# Patient Record
Sex: Male | Born: 1995 | Race: Black or African American | Hispanic: No | Marital: Single | Smoking: Former smoker
Health system: Southern US, Community
[De-identification: ages and names within clinical notes are randomized; demographics above are authoritative.]

## PROBLEM LIST (undated history)

## (undated) DIAGNOSIS — R51 Headache: Secondary | ICD-10-CM

## (undated) DIAGNOSIS — R519 Headache, unspecified: Secondary | ICD-10-CM

## (undated) DIAGNOSIS — R4589 Other symptoms and signs involving emotional state: Secondary | ICD-10-CM

## (undated) HISTORY — PX: NO PAST SURGERIES: SHX2092

---

## 2017-05-14 ENCOUNTER — Other Ambulatory Visit: Payer: Self-pay

## 2017-05-14 ENCOUNTER — Emergency Department (HOSPITAL_COMMUNITY)
Admission: EM | Admit: 2017-05-14 | Discharge: 2017-05-15 | Disposition: A | Discharge status: Home / Self Care | Payer: Medicaid Other | Attending: Emergency Medicine | Admitting: Emergency Medicine

## 2017-05-14 ENCOUNTER — Encounter (HOSPITAL_COMMUNITY): Payer: Self-pay | Admitting: *Deleted

## 2017-05-14 DIAGNOSIS — Z5321 Procedure and treatment not carried out due to patient leaving prior to being seen by health care provider: Secondary | ICD-10-CM | POA: Insufficient documentation

## 2017-05-14 DIAGNOSIS — R51 Headache: Secondary | ICD-10-CM | POA: Insufficient documentation

## 2017-05-14 NOTE — ED Triage Notes (Signed)
The pt is c/o a headache for 4 years  No nausea vomiting he has not taken anything for his headache

## 2017-05-15 NOTE — ED Notes (Signed)
Pt came to desk asking about the wait time. Pt informed that his name was placed in a hall bed and that staff should be out to get him any minute. Pt's name just called for the bed no answer

## 2017-05-17 ENCOUNTER — Encounter (HOSPITAL_COMMUNITY): Payer: Self-pay

## 2017-05-17 ENCOUNTER — Emergency Department (HOSPITAL_COMMUNITY)
Admission: EM | Admit: 2017-05-17 | Discharge: 2017-05-17 | Disposition: A | Discharge status: Home / Self Care | Payer: Medicaid Other | Attending: Emergency Medicine | Admitting: Emergency Medicine

## 2017-05-17 ENCOUNTER — Other Ambulatory Visit: Payer: Self-pay

## 2017-05-17 ENCOUNTER — Emergency Department (HOSPITAL_COMMUNITY): Payer: Medicaid Other

## 2017-05-17 DIAGNOSIS — F17200 Nicotine dependence, unspecified, uncomplicated: Secondary | ICD-10-CM | POA: Diagnosis not present

## 2017-05-17 DIAGNOSIS — R51 Headache: Secondary | ICD-10-CM | POA: Insufficient documentation

## 2017-05-17 DIAGNOSIS — R519 Headache, unspecified: Secondary | ICD-10-CM

## 2017-05-17 HISTORY — DX: Headache, unspecified: R51.9

## 2017-05-17 HISTORY — DX: Headache: R51

## 2017-05-17 IMAGING — CT CT HEAD W/O CM
3 series · 16 of 47 positions shown, 19 images · non-contrast
Comparison: None.

CLINICAL DATA: Pain behind the eyes for 4 years. Getting worse.
Headache.

EXAM:
CT HEAD WITHOUT CONTRAST
TECHNIQUE: Contiguous axial images were obtained from the base of the skull
through the vertex without intravenous contrast.

[Series 2: head wo · axial · 0.47mm/px · z∈[-86,+40]mm · 10 of 30 slices shown, 13 images]
[im 3/30  brain]
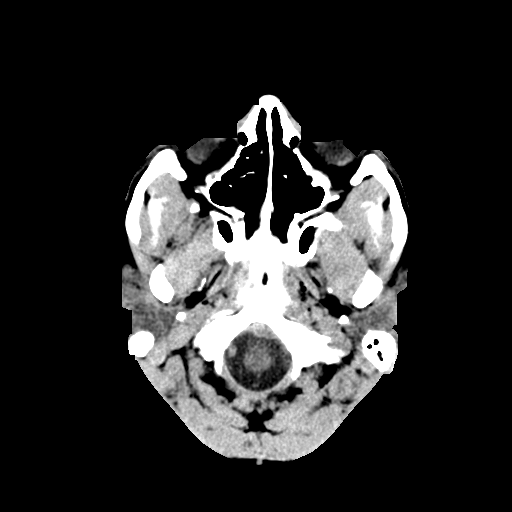
[im 3/30  bone]
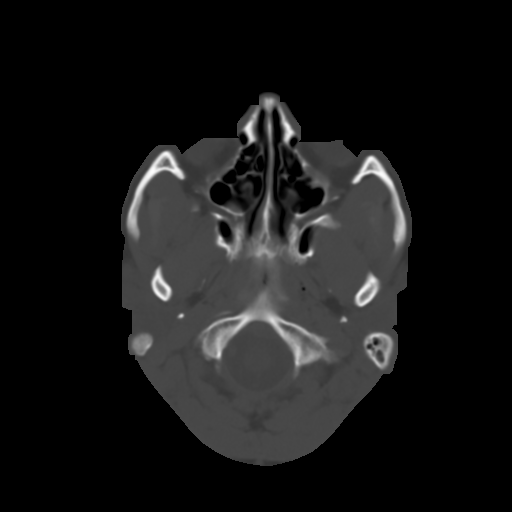
[im 6/30  brain]
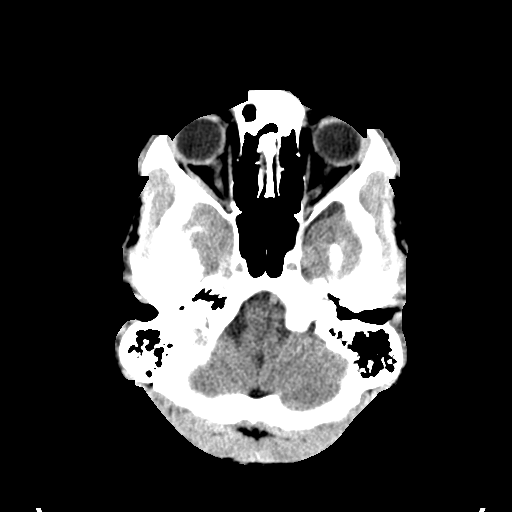
[im 9/30  brain]
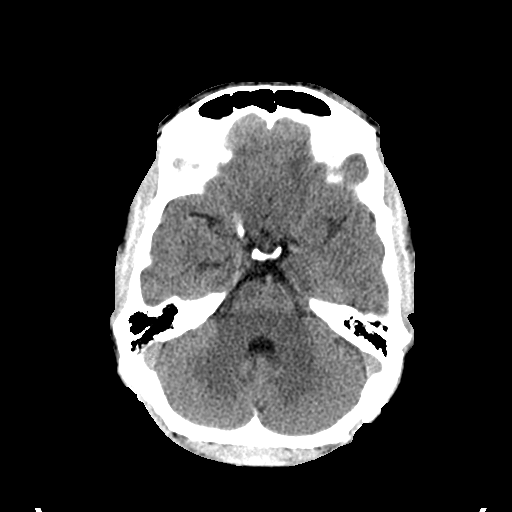
[im 11/30  brain]
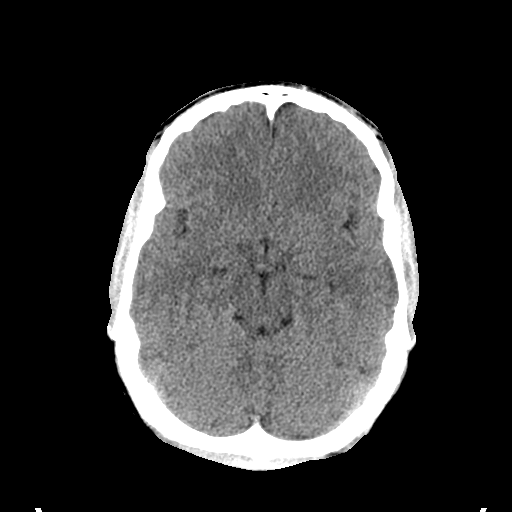
[im 14/30  brain]
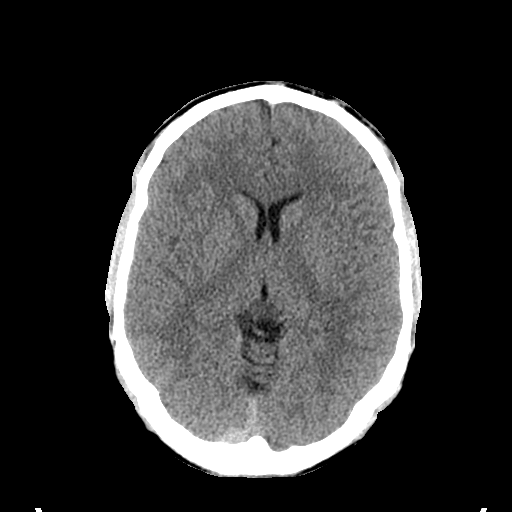
[im 14/30  bone]
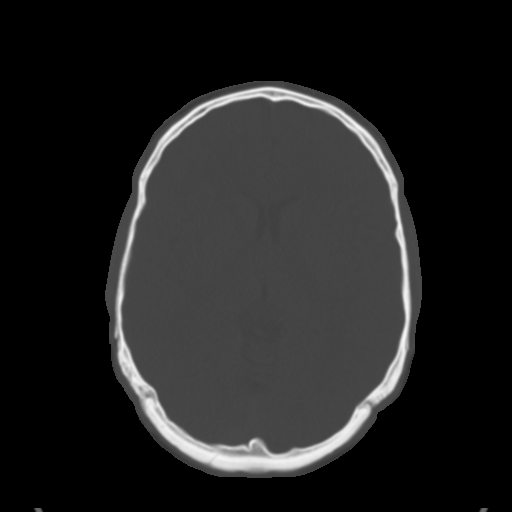
[im 17/30  brain]
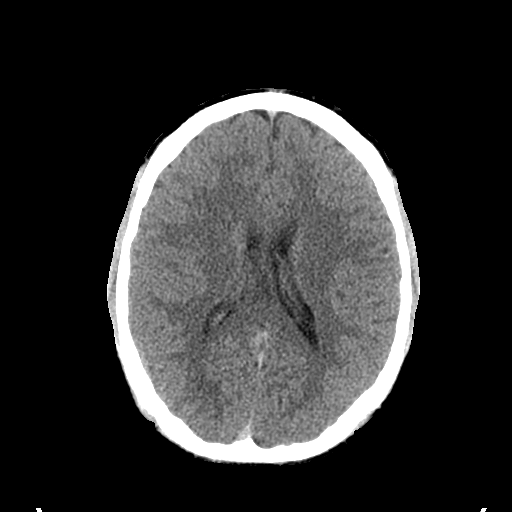
[im 20/30  brain]
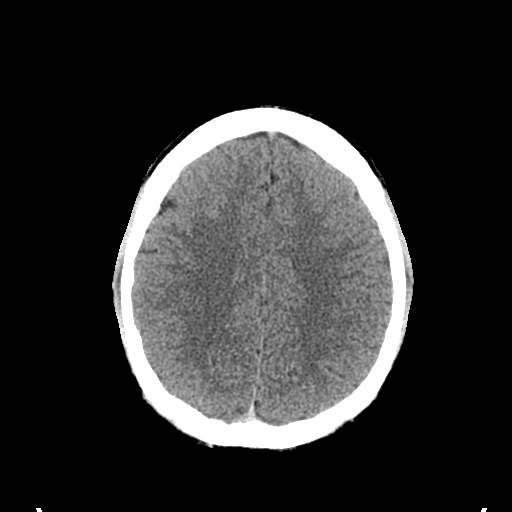
[im 23/30  brain]
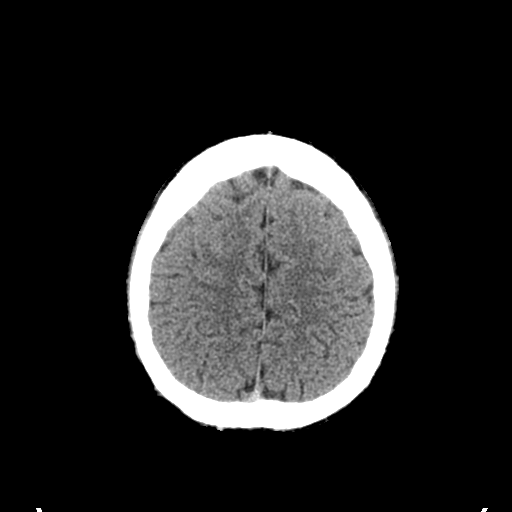
[im 25/30  brain]
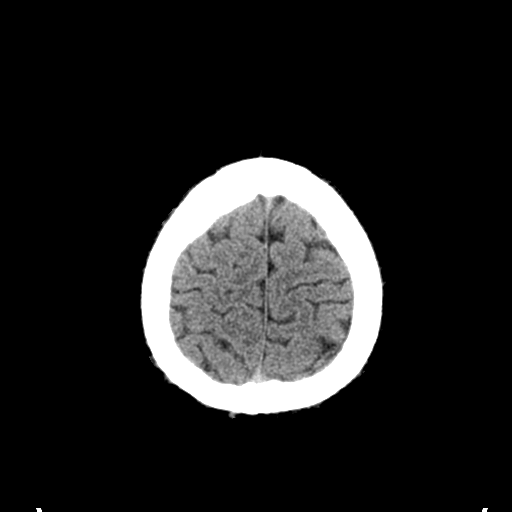
[im 25/30  bone]
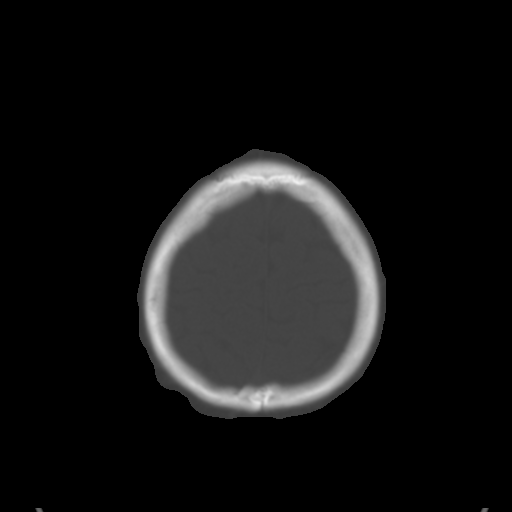
[im 28/30  brain]
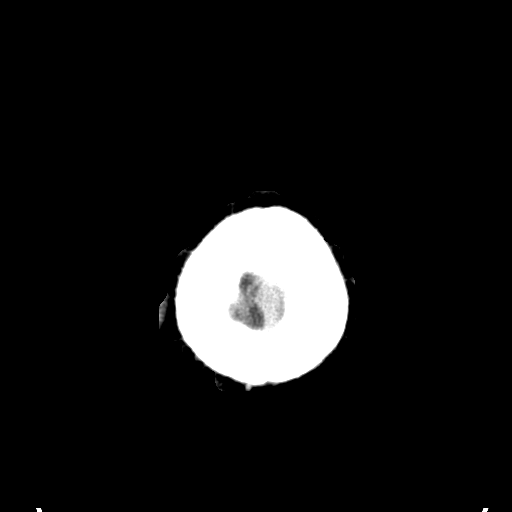

[Series 4: coronal soft tissue · coronal · 0.29mm/px · 3 of 63 slices shown]
[im 21/63  brain]
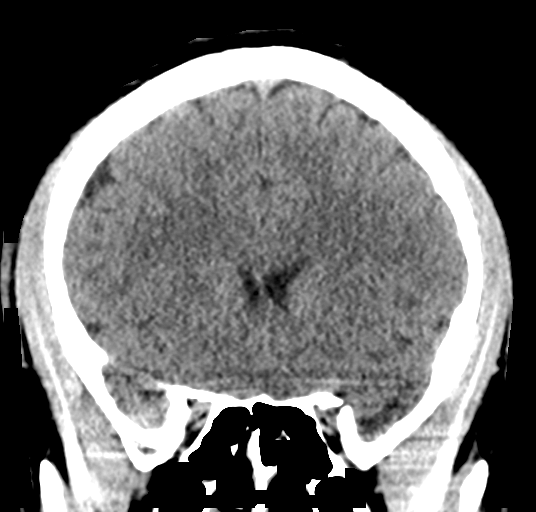
[im 28/63  brain]
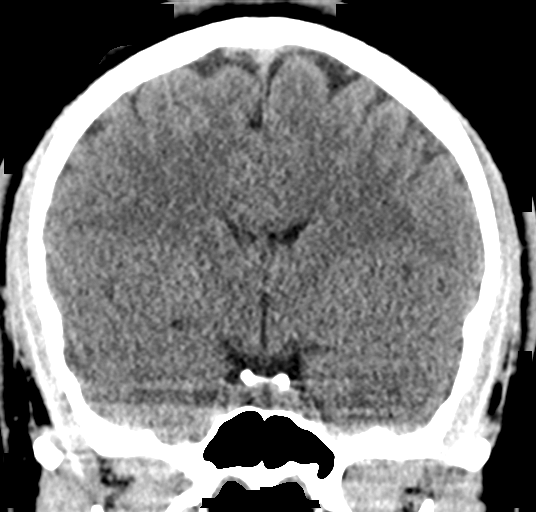
[im 35/63  brain]
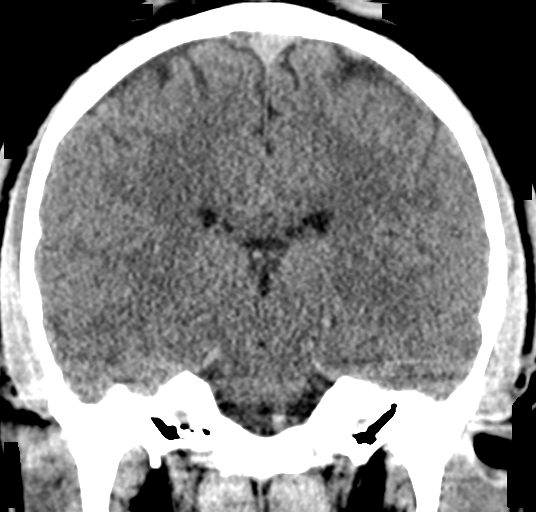

[Series 5: sagittal soft tissue · sagittal · 0.29mm/px · 3 of 50 slices shown]
[im 17/50  brain]
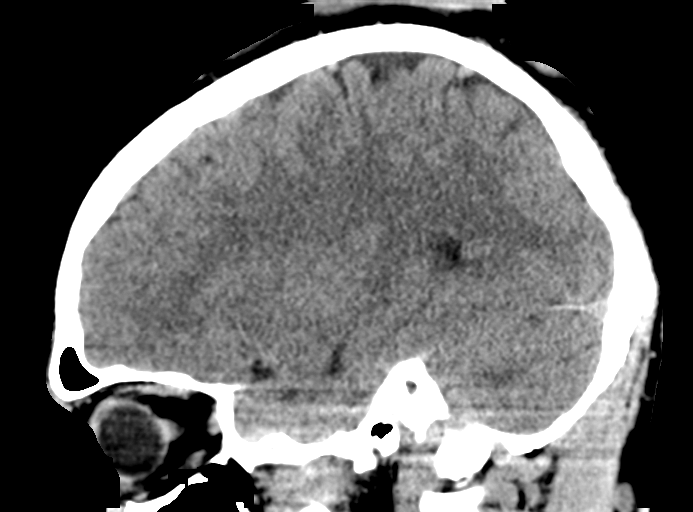
[im 25/50  brain]
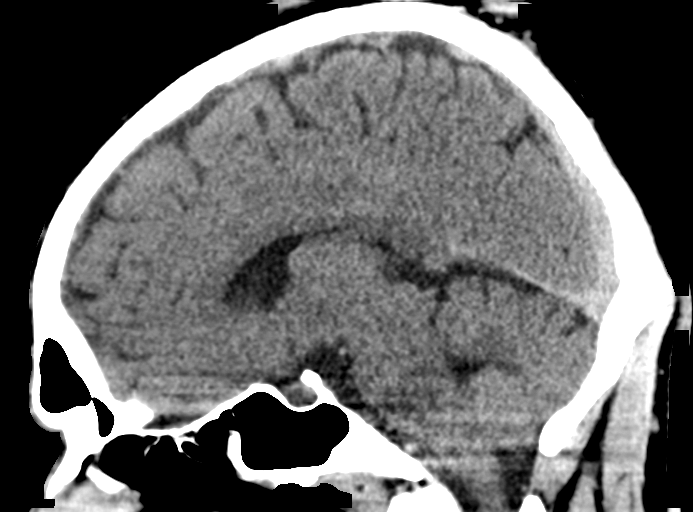
[im 33/50  brain]
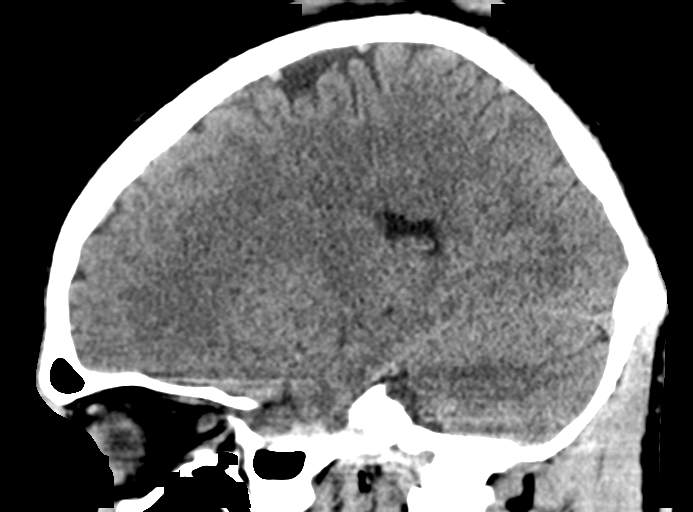

[16 of 47 positions shown; findings below may reference images not displayed]

FINDINGS: Brain: No evidence of acute infarction, hemorrhage, hydrocephalus,
extra-axial collection or mass lesion/mass effect.

Vascular: No hyperdense vessel or unexpected calcification.

Skull: Normal. Negative for fracture or focal lesion.

Sinuses/Orbits: No acute finding.

Other: None.
IMPRESSION: No acute intracranial abnormalities.

## 2017-05-17 MED ORDER — PROMETHAZINE HCL 25 MG PO TABS: 25.0000 mg | ORAL_TABLET | Freq: Three times a day (TID) | ORAL | 0 refills | Status: DC | PRN

## 2017-05-17 NOTE — ED Provider Notes (Signed)
[facility]-EMERGENCY DEPT Provider Note   CSN: [id number] Arrival date & time: 05/17/17  0023     History   Chief Complaint Chief Complaint  Patient presents with  . Headache    HPI Aaron Morris is a 22 y.o. male.  Patient presents to the ER for evaluation of headache.  Patient reports that he has had a history of recurrent headaches over the last 3 or 4 years.  He reports that he has had approximately 4 different episodes in this time period.  Reports that when he gets a headache it generally lasts for more than a week, sometimes 2 weeks.  He developed this headache several days ago.  Reports of burning pain behind his right eye.  He is bothered by lights.  He is concerned that he has developed migraines.  His main concern is that this will keep him out of the [employer].     Past Medical History:  Diagnosis Date  . Headache     There are no active problems to display for this patient.   History reviewed. No pertinent surgical history.      Home Medications    Prior to Admission medications   Medication Sig Start Date End Date Taking? Authorizing Provider  HYDROcodone-acetaminophen (NORCO/VICODIN) 5-325 MG tablet Take 1 tablet by mouth every 6 (six) hours as needed for moderate pain.   Yes [provider]  ibuprofen (ADVIL,MOTRIN) 200 MG tablet Take 400 mg by mouth every 6 (six) hours as needed for moderate pain.   Yes [provider]    Family History History reviewed. No pertinent family history.  Social History Social History   Tobacco Use  . Smoking status: Current Every Day Smoker  . Smokeless tobacco: Never Used  Substance Use Topics  . Alcohol use: Yes  . Drug use: Yes    Types: Marijuana     Allergies   Patient has no known allergies.   Review of Systems Review of Systems  Eyes: Positive for photophobia.  Neurological: Positive for headaches.  All other systems reviewed and are negative.    Physical  Exam Updated Vital Signs BP 128/77 (BP Location: Right Arm)   Pulse (!) 56   Temp 98 F (36.7 C) (Oral)   Resp 16   Ht 6\' 1"  (1.854 m)   Wt 81.6 kg (180 lb)   SpO2 100%   BMI 23.75 kg/m   Physical Exam  Constitutional: He is oriented to person, place, and time. He appears well-developed and well-nourished. No distress.  HENT:  Head: Normocephalic and atraumatic.  Right Ear: Hearing normal.  Left Ear: Hearing normal.  Nose: Nose normal.  Mouth/Throat: Oropharynx is clear and moist and mucous membranes are normal.  Eyes: Pupils are equal, round, and reactive to light. Conjunctivae and EOM are normal.  Neck: Normal range of motion. Neck supple.  Cardiovascular: Regular rhythm, S1 normal and S2 normal. Exam reveals no gallop and no friction rub.  No murmur heard. Pulmonary/Chest: Effort normal and breath sounds normal. No respiratory distress. He exhibits no tenderness.  Abdominal: Soft. Normal appearance and bowel sounds are normal. There is no hepatosplenomegaly. There is no tenderness. There is no rebound, no guarding, no tenderness at McBurney's point and negative Murphy's sign. No hernia.  Musculoskeletal: Normal range of motion.  Neurological: He is alert and oriented to person, place, and time. He has normal strength. No cranial nerve deficit or sensory deficit. Coordination normal. GCS eye subscore is 4. GCS verbal subscore  is 5. GCS motor subscore is 6.  Skin: Skin is warm, dry and intact. No rash noted. No cyanosis.  Psychiatric: He has a normal mood and affect. His speech is normal and behavior is normal. Thought content normal.  Nursing note and vitals reviewed.    ED Treatments / Results  Labs (all labs ordered are listed, but only abnormal results are displayed) Labs Reviewed - No data to display  EKG None  Radiology Ct Head Wo Contrast  Result Date: 05/17/2017 CLINICAL DATA:  Pain behind the eyes for 4 years. Getting worse. Headache. EXAM: CT HEAD WITHOUT  CONTRAST TECHNIQUE: Contiguous axial images were obtained from the base of the skull through the vertex without intravenous contrast. COMPARISON:  None. FINDINGS: Brain: No evidence of acute infarction, hemorrhage, hydrocephalus, extra-axial collection or mass lesion/mass effect. Vascular: No hyperdense vessel or unexpected calcification. Skull: Normal. Negative for fracture or focal lesion. Sinuses/Orbits: No acute finding. Other: None. IMPRESSION: No acute intracranial abnormalities. Electronically Signed   By: Burman NievesWilliam  Stevens M.D.   On: 05/17/2017 05:38    Procedures Procedures (including critical care time)  Medications Ordered in ED Medications - No data to display   Initial Impression / Assessment and Plan / ED Course  I have reviewed the triage vital signs and the nursing notes.  Pertinent labs & imaging results that were available during my care of the patient were reviewed by me and considered in my medical decision making (see chart for details).     Patient presents with headache.  He reports a right-sided headache that is behind the right eye.  He has had severe photophobia and there has been tearing of the eye.  This raises concern for possible ocular migraine.  He has not had any vision change.  Head CT performed, no acute abnormality noted.  Patient was able to fall asleep while performing a workup here in the ER and when he awakened, headache has resolved.  He has not required any further intervention.  Will require follow-up with neurology for presumed migraines or possibly cluster headaches.  Final Clinical Impressions(s) / ED Diagnoses   Final diagnoses:  Bad headache    ED Discharge Orders    None       Gilda CreasePollina, Jabar Krysiak J, MD 05/17/17 [phone]

## 2017-05-17 NOTE — Discharge Instructions (Signed)
Schedule follow-up with 1 of the listed neurology groups to have your headaches reevaluated.

## 2017-05-17 NOTE — ED Triage Notes (Signed)
States pain behind right eyes pain has been off and on for 4 years and getting worse now alert and oriented x 3 moves all extremities.

## 2017-05-21 ENCOUNTER — Encounter: Payer: Self-pay | Admitting: Neurology

## 2017-05-24 ENCOUNTER — Ambulatory Visit (INDEPENDENT_AMBULATORY_CARE_PROVIDER_SITE_OTHER): Payer: Medicaid Other | Admitting: Diagnostic Neuroimaging

## 2017-05-24 ENCOUNTER — Encounter: Payer: Self-pay | Admitting: Diagnostic Neuroimaging

## 2017-05-24 VITALS — BP 110/74 | HR 76 | Ht 73.0 in | Wt 167.2 lb

## 2017-05-24 DIAGNOSIS — G43109 Migraine with aura, not intractable, without status migrainosus: Secondary | ICD-10-CM

## 2017-05-24 MED ORDER — RIZATRIPTAN BENZOATE 10 MG PO TBDP: 10.0000 mg | ORAL_TABLET | ORAL | 11 refills | Status: DC | PRN

## 2017-05-24 MED ORDER — INDOMETHACIN 50 MG PO CAPS: 50.0000 mg | ORAL_CAPSULE | Freq: Two times a day (BID) | ORAL | 1 refills | Status: DC

## 2017-05-24 NOTE — Patient Instructions (Signed)
-   start indomethacin 50mg  daily; may increase to twice a day; trial for 4-6 weeks  - use rizatriptan 10mg  as needed for breakthrough headache; may repeat x 1 after 2 hours; max 2 tabs per day or 8 per month

## 2017-05-24 NOTE — Progress Notes (Signed)
[facility]  PATIENT: Aaron Morris DOB: 1995/12/20  REFERRING CLINICIAN: ER  HISTORY FROM: patient and chart review  REASON FOR VISIT: new consult    HISTORICAL  CHIEF COMPLAINT:  Chief Complaint  Patient presents with  . New Patient (Initial Visit)    Patient reports that his migraines used to be sporadic, but have recently become more frequent.     HISTORY OF PRESENT ILLNESS:   22 year old male here for evaluation of headaches.  Since age 88 years old patient has had 3 or 4 attacks of headaches, lasting 1-2 weeks at a time.  Headaches consist of right-sided eye pain, sharp tapping sensation followed by burning painful sensation lasting hours or days at a time.  No nausea or vomiting.  He has significant tearing and conjunctival injection with these episodes.  No specific triggering factors.  Patient is tried over-the-counter medication without relief.  He went to the emergency room recently and was recommended to follow-up with neurology clinic.  CT scan of the head was unremarkable.  No family history of migraine headaches or cluster headaches.  No prodromal accidents injuries infections or trauma.   REVIEW OF SYSTEMS: Full 14 system review of systems performed and negative with exception of: Blurred vision ringing in ears.  Decreased eating habits and decreased sleep.  ALLERGIES: No Known Allergies  HOME MEDICATIONS: Outpatient Medications Prior to Visit  Medication Sig Dispense Refill  . HYDROcodone-acetaminophen (NORCO/VICODIN) 5-325 MG tablet Take 1 tablet by mouth every 6 (six) hours as needed for moderate pain.    . promethazine (PHENERGAN) 25 MG tablet Take 1 tablet (25 mg total) by mouth every 8 (eight) hours as needed (headache). 30 tablet 0  . ibuprofen (ADVIL,MOTRIN) 200 MG tablet Take 400 mg by mouth every 6 (six) hours as needed for moderate pain.     No facility-administered medications prior to visit.     PAST MEDICAL HISTORY: Past  Medical History:  Diagnosis Date  . Headache     PAST SURGICAL HISTORY: No past surgical history on file.  FAMILY HISTORY: No family history on file.  SOCIAL HISTORY:  Social History   Socioeconomic History  . Marital status: Single    Spouse name: Not on file  . Number of children: Not on file  . Years of education: Not on file  . Highest education level: Not on file  Occupational History  . Not on file  Social Needs  . Financial resource strain: Not on file  . Food insecurity:    Worry: Not on file    Inability: Not on file  . Transportation needs:    Medical: Not on file    Non-medical: Not on file  Tobacco Use  . Smoking status: Current Every Day Smoker  . Smokeless tobacco: Never Used  Substance and Sexual Activity  . Alcohol use: Yes  . Drug use: Yes    Types: Marijuana  . Sexual activity: Not on file  Lifestyle  . Physical activity:    Days per week: Not on file    Minutes per session: Not on file  . Stress: Not on file  Relationships  . Social connections:    Talks on phone: Not on file    Gets together: Not on file    Attends religious service: Not on file    Active member of club or organization: Not on file    Attends meetings of clubs or organizations: Not on file    Relationship status: Not on file  .  Intimate partner violence:    Fear of current or ex partner: Not on file    Emotionally abused: Not on file    Physically abused: Not on file    Forced sexual activity: Not on file  Other Topics Concern  . Not on file  Social History Narrative  . Not on file     PHYSICAL EXAM  GENERAL EXAM/CONSTITUTIONAL: Vitals:  Vitals:   05/24/17 1021  BP: 110/74  Pulse: 76  Weight: 167 lb 3.2 oz (75.8 kg)  Height: 6\' 1"  (1.854 m)     Body mass index is 22.06 kg/m.  No exam data present  Patient is in no distress; well developed, nourished and groomed; neck is supple  CARDIOVASCULAR:  Examination of carotid arteries is normal; no  carotid bruits  Regular rate and rhythm, no murmurs  Examination of peripheral vascular system by observation and palpation is normal  EYES:  Ophthalmoscopic exam of optic discs and posterior segments is normal; no papilledema or hemorrhages  MUSCULOSKELETAL:  Gait, strength, tone, movements noted in Neurologic exam below  NEUROLOGIC: MENTAL STATUS:  No flowsheet data found.  awake, alert, oriented to person, place and time  recent and remote memory intact  normal attention and concentration  language fluent, comprehension intact, naming intact,   fund of knowledge appropriate  CRANIAL NERVE:   2nd - no papilledema on fundoscopic exam  2nd, 3rd, 4th, 6th - pupils equal and reactive to light, visual fields full to confrontation, extraocular muscles intact, no nystagmus  5th - facial sensation symmetric  7th - facial strength symmetric  8th - hearing intact  9th - palate elevates symmetrically, uvula midline  11th - shoulder shrug symmetric  12th - tongue protrusion midline  MOTOR:   normal bulk and tone, full strength in the BUE, BLE  SENSORY:   normal and symmetric to light touch, temperature, vibration  COORDINATION:   finger-nose-finger, fine finger movements normal  REFLEXES:   deep tendon reflexes present and symmetric  GAIT/STATION:   narrow based gait; TANDEM STABLE; romberg is negative    DIAGNOSTIC DATA (LABS, IMAGING, TESTING) - I reviewed patient records, labs, notes, testing and imaging myself where available.  No results found for: WBC, HGB, HCT, MCV, PLT No results found for: NA, K, CL, CO2, GLUCOSE, BUN, CREATININE, CALCIUM, PROT, ALBUMIN, AST, ALT, ALKPHOS, BILITOT, GFRNONAA, GFRAA No results found for: CHOL, HDL, LDLCALC, LDLDIRECT, TRIG, CHOLHDL No results found for: ZOXW9U No results found for: VITAMINB12 No results found for: TSH   05/17/17 CT head [I reviewed images myself and agree with interpretation. -[facility]]  - no  acute findings    ASSESSMENT AND PLAN  22 y.o. year old male here with new onset right-sided periorbital, right scalp severe headaches with migraine features, conjunctival injection and tearing.   Ddx: migraine with aura (vs other trigeminal autonomic cephalgia: cluster HA, SUNCT, hemicrania continua)  1. Migraine with aura and without status migrainosus, not intractable      PLAN:  - trial of rizatriptan as needed - trial of indomethacin 50mg  daily; may increase to twice a day; trial for 4-6 weeks  Meds ordered this encounter  Medications  . DISCONTD: indomethacin (INDOCIN) 50 MG capsule    Sig: Take 1 capsule (50 mg total) by mouth 2 (two) times daily with a meal.    Dispense:  60 capsule    Refill:  1  . DISCONTD: rizatriptan (MAXALT-MLT) 10 MG disintegrating tablet    Sig: Take 1 tablet (10 mg  total) by mouth as needed for migraine. May repeat in 2 hours if needed    Dispense:  9 tablet    Refill:  11  . rizatriptan (MAXALT-MLT) 10 MG disintegrating tablet    Sig: Take 1 tablet (10 mg total) by mouth as needed for migraine. May repeat in 2 hours if needed    Dispense:  9 tablet    Refill:  11  . indomethacin (INDOCIN) 50 MG capsule    Sig: Take 1 capsule (50 mg total) by mouth 2 (two) times daily with a meal.    Dispense:  60 capsule    Refill:  1   Return in about 6 months (around 11/23/2017) for with [place] or [facility].    Suanne MarkerVIKRAM R. Nakhia Levitan, MD 05/24/2017, 11:03 AM Certified in Neurology, Neurophysiology and Neuroimaging  [facility] [address], [address] [place], [place] [address] [phone]

## 2017-06-07 ENCOUNTER — Encounter: Payer: Self-pay | Admitting: Neurology

## 2017-09-08 ENCOUNTER — Ambulatory Visit (INDEPENDENT_AMBULATORY_CARE_PROVIDER_SITE_OTHER): Payer: Medicaid Other | Admitting: Neurology

## 2017-09-08 ENCOUNTER — Encounter: Payer: Self-pay | Admitting: Neurology

## 2017-09-08 VITALS — BP 100/68 | HR 82 | Ht 72.0 in | Wt 168.0 lb

## 2017-09-08 DIAGNOSIS — G44019 Episodic cluster headache, not intractable: Secondary | ICD-10-CM | POA: Diagnosis not present

## 2017-09-08 NOTE — Patient Instructions (Signed)
1.  At earliest onset of the headache, take the rizatriptan.  May repeat tablet once after 2 hours if needed, not to exceed 2 tablets in 24 hours   Cluster Headache Cluster headaches are deeply painful. They normally occur on one side of your head, but they may switch sides. Often, cluster headaches:  Are severe.  Happen often for a few weeks or months and then go away for a while.  Last from 15 minutes to 3 hours.  Happen at the same time each day.  Happen at night.  Happen many times a day.  Follow these instructions at home: Follow instructions from your doctor to care for yourself at home:  Go to bed at the same time each night. Get the same amount of sleep every night.  Avoid alcohol.  Stop smoking if you smoke. This includes cigarettes and e-cigarettes.  Take over-the-counter and prescription medicines only as told by your doctor.  Do not drive or use heavy machinery while taking prescription pain medicine.  Use oxygen as told by your doctor.  Exercise regularly.  Eat a healthy diet.  Write down when each headache happened, what kind of pain you had, how bad your pain was, and what you tried to help your pain. This is called a headache diary. Use it as told by your doctor.  Contact a doctor if:  Your headaches get worse or they happen more often.  Your medicines are not helping. Get help right away if:  You pass out (faint).  You get weak or lose feeling (have numbness) on one side of your body or face.  You see two of everything (double vision).  You feel sick to your stomach (nauseous) or you throw up (vomit), and you do not stop after many hours.  You have trouble with your balance or with walking.  You have trouble talking.  You have neck pain or stiffness.  You have a fever. This information is not intended to replace advice given to you by your health care provider. Make sure you discuss any questions you have with your health care  provider. Document Released: 03/05/2004 Document Revised: 10/04/2015 Document Reviewed: 10/04/2015 Elsevier Interactive Patient Education  2017 [employer]

## 2017-09-08 NOTE — Progress Notes (Signed)
NEUROLOGY CONSULTATION NOTE  Aaron Morris MRN: [id number] DOB: Mar 10, 1995  Referring provider: ED referral Primary care provider: no PCP  Reason for consult:  headache  HISTORY OF PRESENT ILLNESS: Aaron Morris is a 22 year old right-handed male who presents for headache.  History supplemented by ED note.  Onset:  2015 Location:  Right retro-orbital/periorbital/temporal Quality:  burning Intensity:  Severe.  He denies new headache, thunderclap headache or severe headache that wakes him from sleep. Aura:  no Prodrome:  no Postdrome:  no Associated symptoms:  Some blurred vision in right eye.  Lacrimation in right eye.  Some conjunctival injection in right eye.  He denies associated nausea, vomiting, photophobia, phonophobia, osmophobia, unilateral numbness or weakness. Duration:  3 to 4 hours (but usually falls asleep).  Usually occurs at 4-5 pm. Frequency:  Occurs once a year (no particular time of year) for about 2 months. Triggers:  Skipped meals Exacerbating factors:  no Relieving factors:  Takes pain relievers and goes to sleep Activity:  Aggravates.  Current NSAIDS:  no Current analgesics:  no Current triptans:  no Current ergotamine:  no Current anti-emetic:  no Current muscle relaxants:  no Current anti-anxiolytic:  no Current sleep aide:  no Current Antihypertensive medications:  no Current Antidepressant medications:  no Current Anticonvulsant medications:  no Current anti-CGRP:  no Current Vitamins/Herbal/Supplements:  no Current Antihistamines/Decongestants:  no Other therapy:  no Other medication:  no  He went to the ED on 05/17/17.  CT Head without contrast was personally reviewed and unremarkable.  He followed up with another neurologist who prescribed him indomethacin and Maxalt MLT.  He never tried the Maxalt.  Past NSAIDS:  Indomethacin trial (helped), Advil, Aleve Past analgesics:  Tylenol Past abortive triptans:  no Past abortive ergotamine:  no Past  muscle relaxants:  no Past anti-emetic:  no Past antihypertensive medications:  no Past antidepressant medications:  no Past anticonvulsant medications:  no Past anti-CGRP:  no Past vitamins/Herbal/Supplements:  no Past antihistamines/decongestants:  no Other past therapies:  no  Caffeine:  No coffee, tea, soda Alcohol:  no Smoker:  yes Diet:  Hydrates.   Exercise:  yes Depression:  no; Anxiety:  no Other pain:  no Sleep hygiene:  good Family history of headache:  no  PAST MEDICAL HISTORY: Past Medical History:  Diagnosis Date  . Headache     PAST SURGICAL HISTORY: No past surgical history on file.  MEDICATIONS: Current Outpatient Medications on File Prior to Visit  Medication Sig Dispense Refill  . indomethacin (INDOCIN) 50 MG capsule Take 1 capsule (50 mg total) by mouth 2 (two) times daily with a meal. 60 capsule 1  . rizatriptan (MAXALT-MLT) 10 MG disintegrating tablet Take 1 tablet (10 mg total) by mouth as needed for migraine. May repeat in 2 hours if needed 9 tablet 11   No current facility-administered medications on file prior to visit.     ALLERGIES: No Known Allergies  FAMILY HISTORY: No family history on file.  SOCIAL HISTORY: Social History   Socioeconomic History  . Marital status: Single    Spouse name: Not on file  . Number of children: Not on file  . Years of education: Not on file  . Highest education level: 12th grade  Occupational History  . Occupation: unemployed  Social Needs  . Financial resource strain: Not on file  . Food insecurity:    Worry: Not on file    Inability: Not on file  . Transportation needs:  Medical: Not on file    Non-medical: Not on file  Tobacco Use  . Smoking status: Current Every Day Smoker  . Smokeless tobacco: Never Used  Substance and Sexual Activity  . Alcohol use: Not Currently  . Drug use: Yes    Types: Marijuana  . Sexual activity: Not on file  Lifestyle  . Physical activity:    Days per  week: Not on file    Minutes per session: Not on file  . Stress: Not on file  Relationships  . Social connections:    Talks on phone: Not on file    Gets together: Not on file    Attends religious service: Not on file    Active member of club or organization: Not on file    Attends meetings of clubs or organizations: Not on file    Relationship status: Not on file  . Intimate partner violence:    Fear of current or ex partner: Not on file    Emotionally abused: Not on file    Physically abused: Not on file    Forced sexual activity: Not on file  Other Topics Concern  . Not on file  Social History Narrative   Patient is right-handed. He lives with his mother in a one level home. He avoids caffeine. He exercises regularly.    REVIEW OF SYSTEMS: Constitutional: No fevers, chills, or sweats, no generalized fatigue, change in appetite Eyes: No visual changes, double vision, eye pain Ear, nose and throat: No hearing loss, ear pain, nasal congestion, sore throat Cardiovascular: No chest pain, palpitations Respiratory:  No shortness of breath at rest or with exertion, wheezes GastrointestinaI: No nausea, vomiting, diarrhea, abdominal pain, fecal incontinence Genitourinary:  No dysuria, urinary retention or frequency Musculoskeletal:  No neck pain, back pain Integumentary: No rash, pruritus, skin lesions Neurological: as above Psychiatric: No depression, insomnia, anxiety Endocrine: No palpitations, fatigue, diaphoresis, mood swings, change in appetite, change in weight, increased thirst Hematologic/Lymphatic:  No purpura, petechiae. Allergic/Immunologic: no itchy/runny eyes, nasal congestion, recent allergic reactions, rashes  PHYSICAL EXAM: Vitals:   09/08/17 0813  BP: 100/68  Pulse: 82  SpO2: 97%   General: No acute distress.  Patient appears well-groomed.  Head:  Normocephalic/atraumatic Eyes:  fundi examined but not visualized Neck: supple, no paraspinal tenderness, full  range of motion Back: No paraspinal tenderness Heart: regular rate and rhythm Lungs: Clear to auscultation bilaterally. Vascular: No carotid bruits. Neurological Exam: Mental status: alert and oriented to person, place, and time, recent and remote memory intact, fund of knowledge intact, attention and concentration intact, speech fluent and not dysarthric, language intact. Cranial nerves: CN I: not tested CN II: pupils equal, round and reactive to light, visual fields intact CN III, IV, VI:  full range of motion, no nystagmus, no ptosis CN V: facial sensation intact CN VII: upper and lower face symmetric CN VIII: hearing intact CN IX, X: gag intact, uvula midline CN XI: sternocleidomastoid and trapezius muscles intact CN XII: tongue midline Bulk & Tone: normal, no fasciculations. Motor:  5/5 throughout  Sensation: temperature and vibration sensation intact. Deep Tendon Reflexes:  2+ throughout, toes downgoing.  Finger to nose testing:  Without dysmetria.  Heel to shin:  Without dysmetria.  Gait:  Normal station and stride.  Romberg negative.  IMPRESSION: Episodic cluster headache  PLAN: As they are episodic, I would defer a daily preventative for now.  If they restart, I would first try and prednisone taper.  I advised that he try  the rizatriptan if he should have another headache (1 tablet earliest onset of headache and may repeat once after two hours if needed).  If ineffective, consider sumatriptan or zomig nasal spray or sumatriptan injection.  Limit pain relievers to no more than 2 days out of week to prevent rebound headache.  Keep headache diary.  [place] planning [place] to [place] in a [place], so I advised that he establish care with a neurologist after he moves.  Thank you for allowing me to take part in the care of this patient.  Shon MilletAdam Jaffe, DO

## 2019-01-25 ENCOUNTER — Emergency Department (HOSPITAL_COMMUNITY): Payer: Self-pay

## 2019-01-25 ENCOUNTER — Encounter (HOSPITAL_COMMUNITY): Payer: Self-pay | Admitting: Emergency Medicine

## 2019-01-25 ENCOUNTER — Emergency Department (HOSPITAL_COMMUNITY)
Admission: EM | Admit: 2019-01-25 | Discharge: 2019-01-25 | Disposition: A | Discharge status: Home / Self Care | Payer: Self-pay | Attending: Emergency Medicine | Admitting: Emergency Medicine

## 2019-01-25 ENCOUNTER — Other Ambulatory Visit: Payer: Self-pay

## 2019-01-25 DIAGNOSIS — Y929 Unspecified place or not applicable: Secondary | ICD-10-CM | POA: Insufficient documentation

## 2019-01-25 DIAGNOSIS — Y939 Activity, unspecified: Secondary | ICD-10-CM | POA: Insufficient documentation

## 2019-01-25 DIAGNOSIS — Z79899 Other long term (current) drug therapy: Secondary | ICD-10-CM | POA: Insufficient documentation

## 2019-01-25 DIAGNOSIS — W2209XA Striking against other stationary object, initial encounter: Secondary | ICD-10-CM | POA: Insufficient documentation

## 2019-01-25 DIAGNOSIS — M25531 Pain in right wrist: Secondary | ICD-10-CM

## 2019-01-25 DIAGNOSIS — S62141A Displaced fracture of body of hamate [unciform] bone, right wrist, initial encounter for closed fracture: Secondary | ICD-10-CM | POA: Insufficient documentation

## 2019-01-25 DIAGNOSIS — Y999 Unspecified external cause status: Secondary | ICD-10-CM | POA: Insufficient documentation

## 2019-01-25 DIAGNOSIS — F17210 Nicotine dependence, cigarettes, uncomplicated: Secondary | ICD-10-CM | POA: Insufficient documentation

## 2019-01-25 IMAGING — DX DG HAND COMPLETE 3+V*R*
3 series · 3 of 3 positions shown · non-contrast
Comparison: None.

CLINICAL DATA: Right hand pain after hitting a wall today, worse
around the 1st metacarpal.

EXAM:
RIGHT HAND - COMPLETE 3+ VIEW

[hand pa]
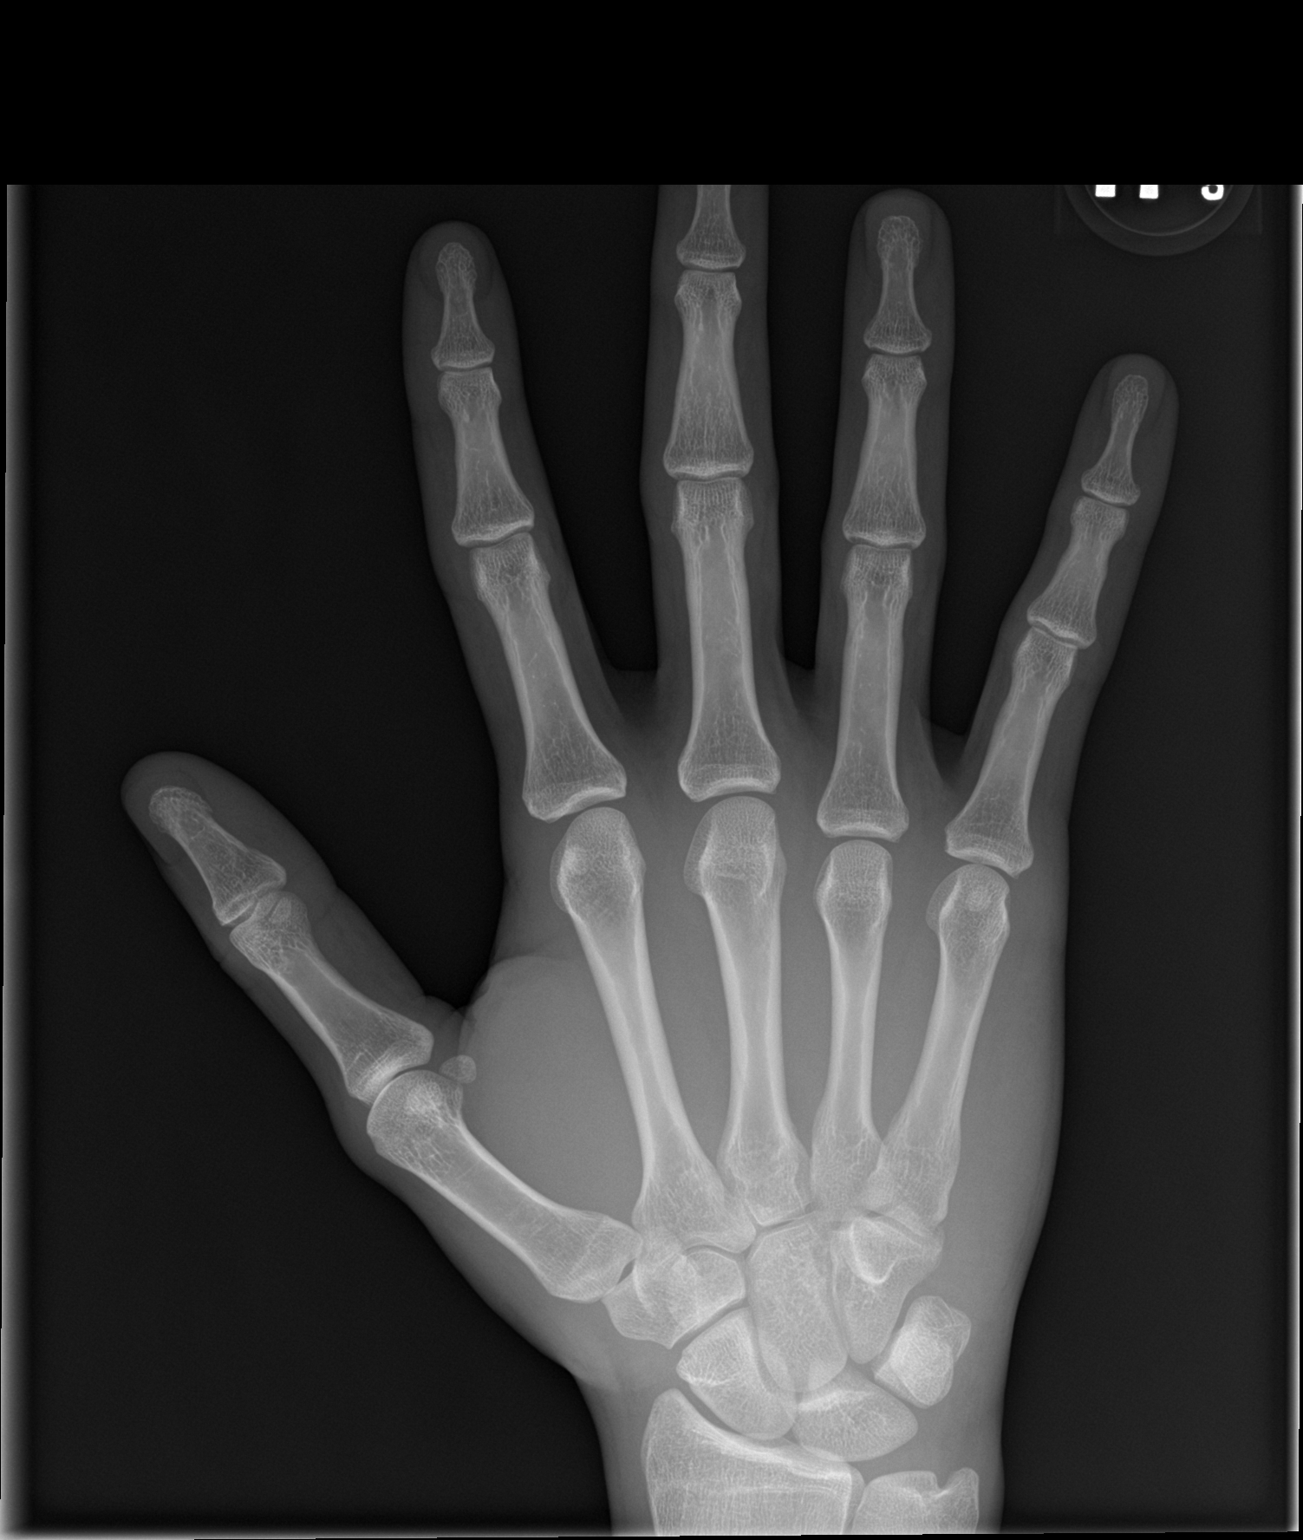

[hand obl]
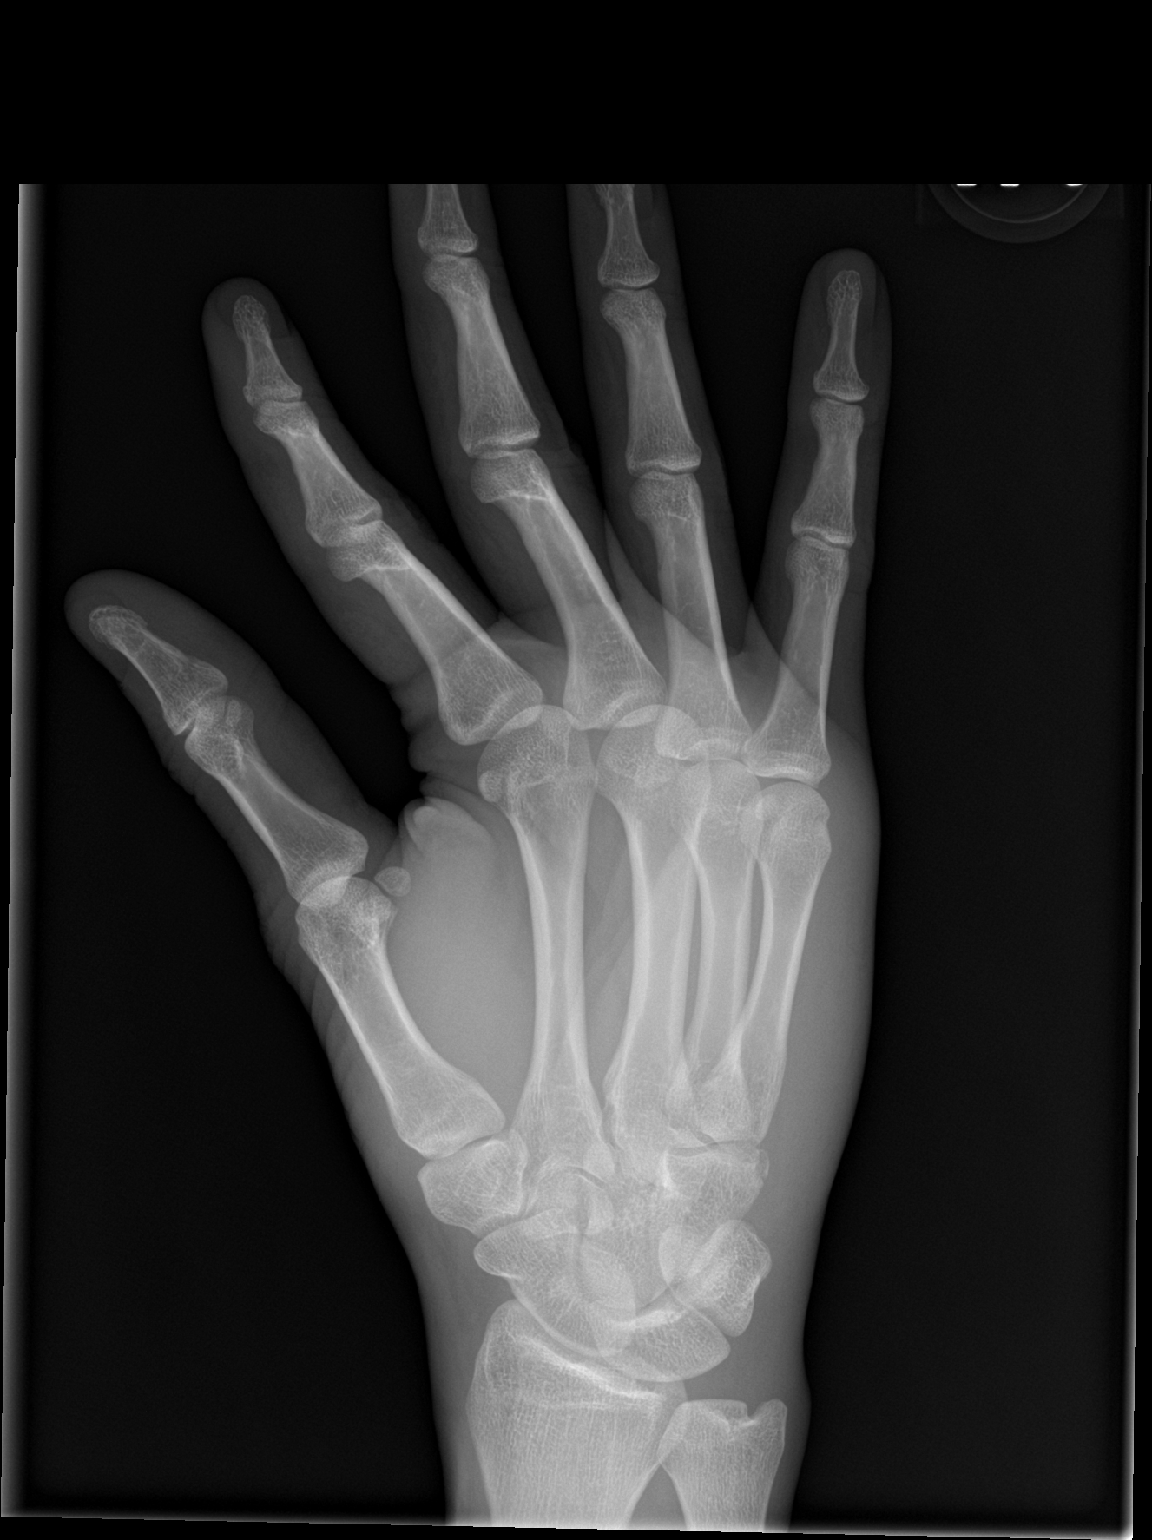

[hand lat]
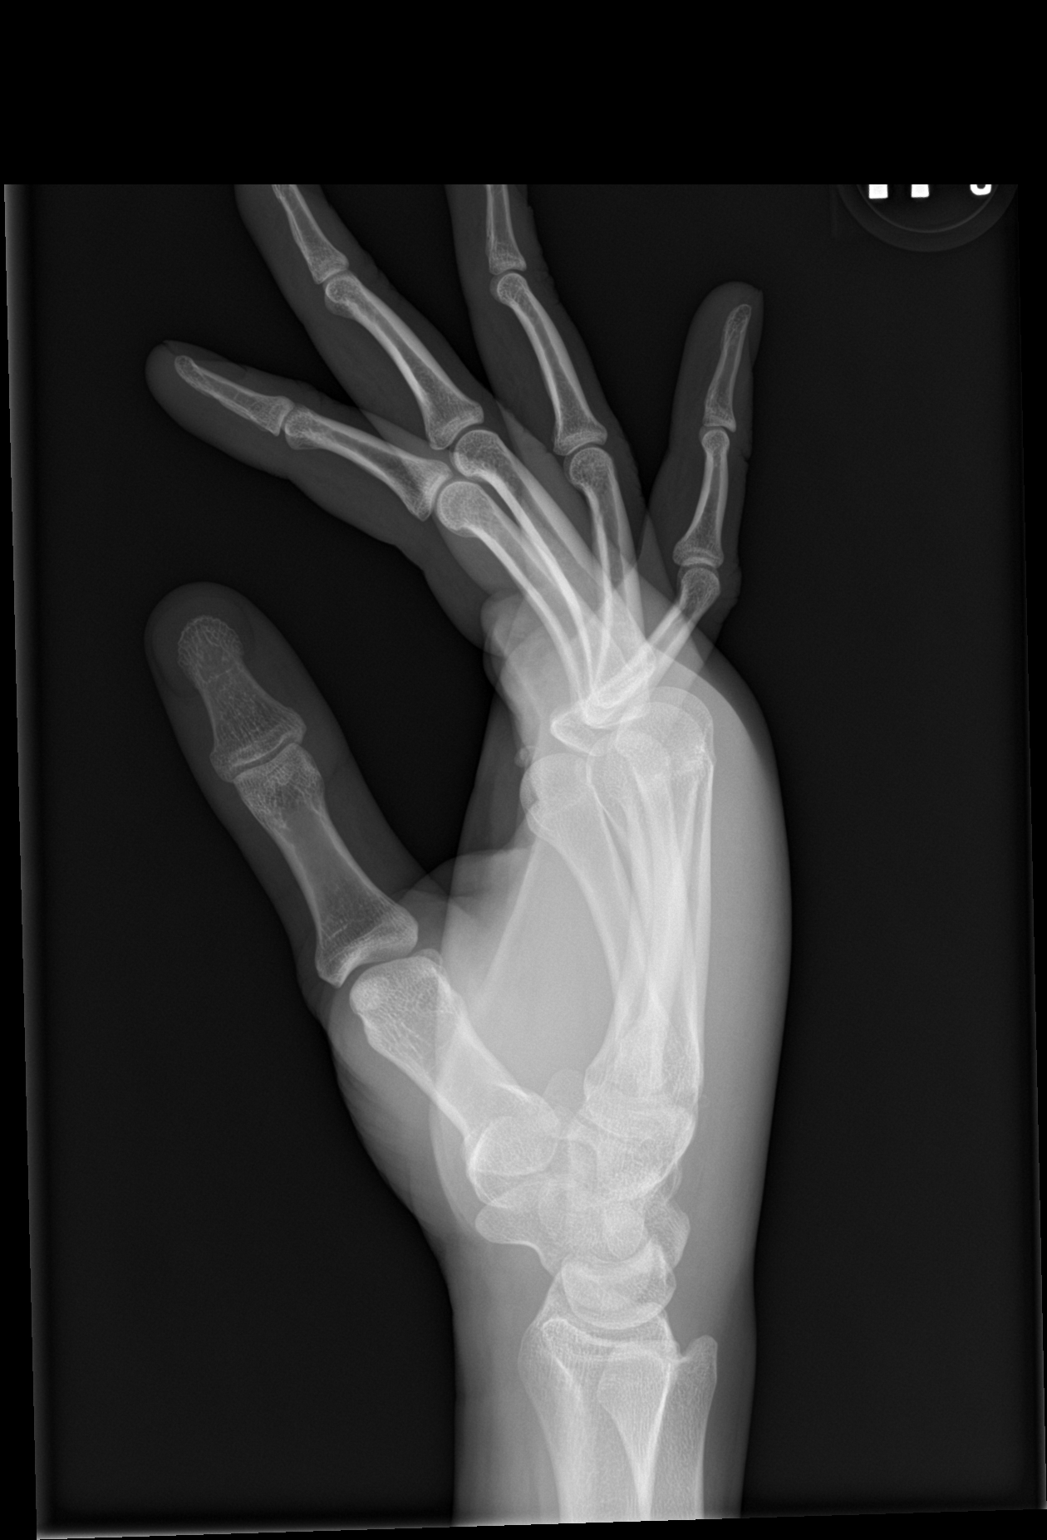

[3 of 3 positions shown; findings below may reference images not displayed]

FINDINGS: Diffuse dorsal soft tissue swelling. On the oblique view, there is
evidence of a small, nondisplaced fracture in the ulnar aspect of
the distal portion of the hamate. There is also a suggestion of a
small bone fragment dorsally at this level on the lateral view. No
other fractures seen.
IMPRESSION: Probable small fracture of the ulnar aspect of the distal hamate
with a small dorsally displaced fragment.

## 2019-01-25 NOTE — ED Notes (Signed)
Ortho at bedside.

## 2019-01-25 NOTE — ED Triage Notes (Signed)
Onset one day ago punched a wall with right hand today increased swelling pain currently 2/10 achy sore. Radial pulse +2 full sensation.

## 2019-01-25 NOTE — ED Provider Notes (Signed)
[facility] EMERGENCY DEPARTMENT Provider Note   CSN: [id number] Arrival date & time: 01/25/19  1443     History Chief Complaint  Patient presents with  . Hand Pain    Aaron Morris is a 23 y.o. male   HPI Patient is a 23 year old male presents today with right hand pain after punching wall approximately 3 PM yesterday.  Patient states pain is constant, 2/10, mild, nonradiating, achy and denies any laceration, bleeding, bruising but endorses moderate swelling over entire back of hand.  Patient states that he did not punch person, states that he punched a wall.  Denies any other injuries.  States that he has some pain in his wrist as well.  Denies any previous surgeries states he is right-handed.     Past Medical History:  Diagnosis Date  . Headache     There are no problems to display for this patient.   History reviewed. No pertinent surgical history.     No family history on file.  Social History   Tobacco Use  . Smoking status: Current Every Day Smoker  . Smokeless tobacco: Never Used  Substance Use Topics  . Alcohol use: Not Currently  . Drug use: Yes    Types: Marijuana    Home Medications Prior to Admission medications   Medication Sig Start Date End Date Taking? Authorizing Provider  indomethacin (INDOCIN) 50 MG capsule Take 1 capsule (50 mg total) by mouth 2 (two) times daily with a meal. 05/24/17   Penumalli, Glenford Bayley, MD  rizatriptan (MAXALT-MLT) 10 MG disintegrating tablet Take 1 tablet (10 mg total) by mouth as needed for migraine. May repeat in 2 hours if needed 05/24/17   Penumalli, Glenford Bayley, MD    Allergies    Patient has no known allergies.  Review of Systems   Review of Systems  Constitutional: Negative for chills and fever.  HENT: Negative for congestion.   Respiratory: Negative for shortness of breath.   Cardiovascular: Negative for chest pain.  Gastrointestinal: Negative for abdominal pain.  Musculoskeletal: Negative  for neck pain.       Right hand pain and swelling    Physical Exam Updated Vital Signs BP 135/76   Pulse 82   Resp 16   Ht 6\' 1"  (1.854 m)   Wt 81.6 kg   SpO2 100%   BMI 23.75 kg/m   Physical Exam Vitals and nursing note reviewed.  Constitutional:      General: He is not in acute distress.    Appearance: Normal appearance. He is not ill-appearing.  HENT:     Head: Normocephalic and atraumatic.  Eyes:     General: No scleral icterus.       Right eye: No discharge.        Left eye: No discharge.     Conjunctiva/sclera: Conjunctivae normal.  Cardiovascular:     Rate and Rhythm: Normal rate.     Comments: Pulses intact bilateral radius Pulmonary:     Effort: Pulmonary effort is normal.     Breath sounds: No stridor.  Musculoskeletal:     Comments: Right hand with swelling over the dorsum of the wrist to MCPs.  No bruising.  Tenderness palpation over fifth metacarpal midshaft.  No obvious deformity.  Moderate tenderness over 2nd metacarpal bone as well.   No snuffbox tenderness.  Skin:    General: Skin is warm and dry.     Capillary Refill: Capillary refill takes less than 2 seconds.  Findings: No bruising.  Neurological:     Mental Status: He is alert and oriented to person, place, and time. Mental status is at baseline.     Comments: Sensation intact in C4, C5, C6, C7, C8 distribution.  Motor intact radial ulnar and medial distribution      ED Results / Procedures / Treatments   Labs (all labs ordered are listed, but only abnormal results are displayed) Labs Reviewed - No data to display  EKG None  Radiology DG Hand Complete Right  Result Date: 01/25/2019 CLINICAL DATA:  Right hand pain after hitting a wall today, worse around the 1st metacarpal. EXAM: RIGHT HAND - COMPLETE 3+ VIEW COMPARISON:  None. FINDINGS: Diffuse dorsal soft tissue swelling. On the oblique view, there is evidence of a small, nondisplaced fracture in the ulnar aspect of the distal  portion of the hamate. There is also a suggestion of a small bone fragment dorsally at this level on the lateral view. No other fractures seen. IMPRESSION: Probable small fracture of the ulnar aspect of the distal hamate with a small dorsally displaced fragment. Electronically Signed   By: Beckie SaltsSteven  Reid M.D.   On: 01/25/2019 16:19    Procedures Procedures (including critical care time)  SPLINT APPLICATION Date/Time: 5:44 PM Authorized by: Gailen ShelterWylder S Dre Gamino Consent: Verbal consent obtained. Risks and benefits: risks, benefits and alternatives were discussed Consent given by: patient Splint applied by: orthopedic technician Location details: right wrist  Splint type: volar short arm Supplies used: orthoglass and ace wrap with padding Post-procedure: The splinted body part was neurovascularly unchanged following the procedure. Patient tolerance: Patient tolerated the procedure well with no immediate complications.     Medications Ordered in ED Medications - No data to display  ED Course  I have reviewed the triage vital signs and the nursing notes.  Pertinent labs & imaging results that were available during my care of the patient were reviewed by me and considered in my medical decision making (see chart for details).  Patient is a 23 year old right-handed male with no pertinent past medical history presented today for right hand and wrist pain after punching a wall approximately 24 hours ago.  Patient has sensation, able to move all fingers and has good cap refill and pulses.  No open lacerations or abrasions.   X-ray shows small fracture of hamate. Discussed with Dr. Izora Ribasoley who recommends splint and follow-up with him in 2 weeks.  Discussed RICE therapy with patient as well as the splint care.  Patient understand that he needs to follow-up with Dr. Izora Ribasoley.  Orthotec consulted for splint placement.  Patient is DNVI after splint placement.  Is well-appearing understands follow-up and  accept complaint.     This patient appears reasonably screened and I doubt any other medical condition requiring further workup, evaluation, or treatment in the ED at this time prior to discharge.   Patient's vitals are WNL apart from vital sign abnormalities discussed above, patient is in NAD, and able to ambulate in the ED at their baseline. Pain has been managed or a plan has been made for home management and has no complaints prior to discharge. Patient is comfortable with above plan and is stable for discharge at this time. All questions were answered prior to disposition. Results from the ER workup discussed with the patient face to face and all questions answered to the best of my ability. The patient is safe for discharge with strict return precautions. Patient appears safe for discharge with appropriate  follow-up. Conveyed my impression with the patient and they voiced understanding and are agreeable to plan.   An After Visit Summary was printed and given to the patient.  Portions of this note were generated with Lobbyist. Dictation errors may occur despite best attempts at proofreading.      MDM Rules/Calculators/A&P                       Final Clinical Impression(s) / ED Diagnoses Final diagnoses:  Closed displaced fracture of hamate bone of right wrist, unspecified portion of hamate, initial encounter  Right wrist pain    Rx / DC Orders ED Discharge Orders    None       Tedd Sias, [place] 01/25/19 2242    Hayden Rasmussen, MD 01/26/19 1017

## 2019-01-25 NOTE — ED Notes (Signed)
Pt dc'd home w/all belongings, a/o x4, ambulatory on dc, drove self home no narcotics given in ED  

## 2019-01-25 NOTE — Progress Notes (Signed)
Orthopedic Tech Progress Note Patient Details:  Aaron Morris 04-14-1995 [id number]  Ortho Devices Type of Ortho Device: Volar splint Ortho Device/Splint Location: RUE Ortho Device/Splint Interventions: Application, Ordered   Post Interventions Patient Tolerated: Well Instructions Provided: Care of device, Adjustment of device   Janit Pagan 01/25/2019, 6:31 PM

## 2019-01-25 NOTE — Discharge Instructions (Signed)
Apply a compressive ACE bandage. Rest and elevate the affected painful area.  Apply cold compresses intermittently as needed.  As pain recedes, begin normal activities slowly as tolerated.  Call if symptoms persist.  Please follow-up with Dr. Apolonio Schneiders with [facility] for follow-up in 2 weeks.  Please call make his appointment.  Please rest ice and elevate extremity.  Wear splint please read attached instructions.

## 2019-06-14 ENCOUNTER — Ambulatory Visit (HOSPITAL_COMMUNITY)
Admission: EM | Admit: 2019-06-14 | Discharge: 2019-06-14 | Disposition: A | Discharge status: Home / Self Care | Payer: HRSA Program | Attending: Family Medicine | Admitting: Family Medicine

## 2019-06-14 ENCOUNTER — Encounter (HOSPITAL_COMMUNITY): Payer: Self-pay

## 2019-06-14 ENCOUNTER — Other Ambulatory Visit: Payer: Self-pay

## 2019-06-14 DIAGNOSIS — Z20822 Contact with and (suspected) exposure to covid-19: Secondary | ICD-10-CM

## 2019-06-14 NOTE — ED Triage Notes (Signed)
Pt requests covid testing 2/2 younger brother testing positive two days ago. Pt denies any URI sx, abdom pain, n/v/d, fever, chills, HA or other physical complaint.

## 2019-06-14 NOTE — Discharge Instructions (Signed)
You have been tested for COVID-19 today. °If your test returns positive, you will receive a phone call from [facility] regarding your results. °Negative test results are not called. °Both positive and negative results area always visible on MyChart. °If you do not have a MyChart account, sign up instructions are provided in your discharge papers. °Please do not hesitate to contact us should you have questions or concerns. ° °

## 2019-06-15 LAB — SARS CORONAVIRUS 2 (TAT 6-24 HRS): SARS Coronavirus 2: NEGATIVE

## 2019-06-17 NOTE — ED Provider Notes (Signed)
  [facility]   [id number] 06/14/19 Arrival Time: 1854  ASSESSMENT & PLAN:  1. Exposure to COVID-19 virus      COVID-19 testing sent. See letter/work note on file for self-isolation guidelines.   Follow-up Information    [facility].   Specialty: Urgent Care Why: As needed. Contact information: [address] [place] [address] [phone]          Reviewed expectations re: course of current medical issues. Questions answered. Outlined signs and symptoms indicating need for more acute intervention. Understanding verbalized. After Visit Summary given.   SUBJECTIVE: History from: patient. Aaron Morris is a 24 y.o. male who requests COVID-19 testing. Known COVID-19 contact: sibling. Recent travel: none. Denies: runny nose, congestion, fever, cough, sore throat, difficulty breathing and headache. Normal PO intake without n/v/d.    OBJECTIVE:  Vitals:   06/14/19 1932  BP: 112/66  Pulse: 80  Resp: 18  Temp: 98.1 F (36.7 C)  TempSrc: Oral  SpO2: 99%    General appearance: alert; no distress Eyes: PERRLA; EOMI; conjunctiva normal HENT: [place]; AT; nasal mucosa normal; oral mucosa normal Neck: supple  Lungs: speaks full sentences without difficulty; unlabored Extremities: no edema Skin: warm and dry Neurologic: normal gait Psychological: alert and cooperative; normal mood and affect  Labs:  Labs Reviewed  SARS CORONAVIRUS 2 (TAT 6-24 HRS)     No Known Allergies  Past Medical History:  Diagnosis Date  . Headache    Social History   Socioeconomic History  . Marital status: Single    Spouse name: Not on file  . Number of children: Not on file  . Years of education: Not on file  . Highest education level: 12th grade  Occupational History  . Occupation: unemployed  Tobacco Use  . Smoking status: Current Every Day Smoker  . Smokeless tobacco: Never Used  Substance and Sexual Activity  .  Alcohol use: Not Currently  . Drug use: Yes    Types: Marijuana  . Sexual activity: Not on file  Other Topics Concern  . Not on file  Social History Narrative   Patient is right-handed. He lives with his mother in a one level home. He avoids caffeine. He exercises regularly.   Social Determinants of Health   Financial Resource Strain:   . Difficulty of Paying Living Expenses:   Food Insecurity:   . Worried About Programme researcher, broadcasting/film/video in the Last Year:   . Barista in the Last Year:   Transportation Needs:   . Freight forwarder (Medical):   Marland Kitchen Lack of Transportation (Non-Medical):   Physical Activity:   . Days of Exercise per Week:   . Minutes of Exercise per Session:   Stress:   . Feeling of Stress :   Social Connections:   . Frequency of Communication with Friends and Family:   . Frequency of Social Gatherings with Friends and Family:   . Attends Religious Services:   . Active Member of Clubs or Organizations:   . Attends Banker Meetings:   Marland Kitchen Marital Status:   Intimate Partner Violence:   . Fear of Current or Ex-Partner:   . Emotionally Abused:   Marland Kitchen Physically Abused:   . Sexually Abused:    History reviewed. No pertinent family history. History reviewed. No pertinent surgical history.   Mardella Layman, MD 06/17/19 [phone]

## 2019-08-01 ENCOUNTER — Telehealth: Payer: Self-pay | Admitting: Neurology

## 2019-08-01 NOTE — Telephone Encounter (Signed)
No answer

## 2019-08-01 NOTE — Telephone Encounter (Signed)
I can only speak for the headaches, not suicidal ideation.  However, I cannot provide him with a letter as I have not seen him since July 2019.  He would need a follow up appointment.  Otherwise, a letter to completely clear him would have to come from his PCP.

## 2019-08-01 NOTE — Telephone Encounter (Signed)
FYI

## 2019-08-01 NOTE — Telephone Encounter (Signed)
[facility] 07/31/19 @ 5:37pm:  "Caller states he is homeless and SI.  Nurse assessment: Caller reports that he needs a paper that says he is okay, and has not gotten headaches in the last 2 years so he can go back into the [employer] - homeless and SI - Patient is not threatening suicide now but had suicidal behaviors in past 3 months.  Sent to urgent queue-  Patient was instructed to see HCP or PCP within 4 hours. Care advice given per Suicide Concerns guideline and instructed to call back if reassurance and education - suicide attempt.  Caller understands and will comply.  Go to facility undecided."

## 2019-08-03 NOTE — Telephone Encounter (Signed)
No answer

## 2019-10-29 ENCOUNTER — Encounter (HOSPITAL_COMMUNITY): Payer: Self-pay | Admitting: *Deleted

## 2019-10-29 ENCOUNTER — Emergency Department (HOSPITAL_COMMUNITY)
Admission: EM | Admit: 2019-10-29 | Discharge: 2019-10-29 | Disposition: A | Discharge status: Home / Self Care | Payer: Self-pay | Attending: Emergency Medicine | Admitting: Emergency Medicine

## 2019-10-29 ENCOUNTER — Other Ambulatory Visit: Payer: Self-pay

## 2019-10-29 DIAGNOSIS — R45851 Suicidal ideations: Secondary | ICD-10-CM | POA: Insufficient documentation

## 2019-10-29 DIAGNOSIS — F17210 Nicotine dependence, cigarettes, uncomplicated: Secondary | ICD-10-CM | POA: Insufficient documentation

## 2019-10-29 DIAGNOSIS — Z20822 Contact with and (suspected) exposure to covid-19: Secondary | ICD-10-CM | POA: Insufficient documentation

## 2019-10-29 DIAGNOSIS — F329 Major depressive disorder, single episode, unspecified: Secondary | ICD-10-CM | POA: Insufficient documentation

## 2019-10-29 HISTORY — DX: Other symptoms and signs involving emotional state: R45.89

## 2019-10-29 LAB — CBC
HCT: 46.2 % (ref 39.0–52.0)
Hemoglobin: 15.5 g/dL (ref 13.0–17.0)
MCH: 30.4 pg (ref 26.0–34.0)
MCHC: 33.5 g/dL (ref 30.0–36.0)
MCV: 90.6 fL (ref 80.0–100.0)
Platelets: 187 10*3/uL (ref 150–400)
RBC: 5.1 MIL/uL (ref 4.22–5.81)
RDW: 13.5 % (ref 11.5–15.5)
WBC: 7.8 10*3/uL (ref 4.0–10.5)
nRBC: 0 % (ref 0.0–0.2)

## 2019-10-29 LAB — RAPID URINE DRUG SCREEN, HOSP PERFORMED
Amphetamines: NOT DETECTED
Barbiturates: NOT DETECTED
Benzodiazepines: NOT DETECTED
Cocaine: NOT DETECTED
Opiates: NOT DETECTED
Tetrahydrocannabinol: POSITIVE — AB

## 2019-10-29 LAB — SALICYLATE LEVEL: Salicylate Lvl: <7 mg/dL — ABNORMAL LOW (ref 7.0–30.0)

## 2019-10-29 LAB — COMPREHENSIVE METABOLIC PANEL
ALT: 26 U/L (ref 0–44)
AST: 18 U/L (ref 15–41)
Albumin: 4.3 g/dL (ref 3.5–5.0)
Alkaline Phosphatase: 64 U/L (ref 38–126)
Anion gap: 13 (ref 5–15)
BUN: 22 mg/dL — ABNORMAL HIGH (ref 6–20)
CO2: 24 mmol/L (ref 22–32)
Calcium: 9.2 mg/dL (ref 8.9–10.3)
Chloride: 102 mmol/L (ref 98–111)
Creatinine, Ser: 1.05 mg/dL (ref 0.61–1.24)
GFR calc Af Amer: >60 mL/min (ref >60)
GFR calc non Af Amer: >60 mL/min (ref >60)
Glucose, Bld: 107 mg/dL — ABNORMAL HIGH (ref 70–99)
Potassium: 4 mmol/L (ref 3.5–5.1)
Sodium: 139 mmol/L (ref 135–145)
Total Bilirubin: 0.3 mg/dL (ref 0.3–1.2)
Total Protein: 7.1 g/dL (ref 6.5–8.1)

## 2019-10-29 LAB — ACETAMINOPHEN LEVEL: Acetaminophen (Tylenol), Serum: <10 ug/mL — ABNORMAL LOW (ref 10–30)

## 2019-10-29 LAB — SARS CORONAVIRUS 2 BY RT PCR (HOSPITAL ORDER, PERFORMED IN ~~LOC~~ HOSPITAL LAB): SARS Coronavirus 2: NEGATIVE

## 2019-10-29 LAB — ETHANOL: Alcohol, Ethyl (B): <10 mg/dL (ref <10)

## 2019-10-29 MED ORDER — ZOLPIDEM TARTRATE 5 MG PO TABS: 5.0000 mg | ORAL_TABLET | Freq: Every evening | ORAL | Status: DC | PRN

## 2019-10-29 MED ORDER — LORAZEPAM 1 MG PO TABS: 1.0000 mg | ORAL_TABLET | ORAL | Status: DC | PRN

## 2019-10-29 MED ORDER — ACETAMINOPHEN 325 MG PO TABS: 650.0000 mg | ORAL_TABLET | ORAL | Status: DC | PRN

## 2019-10-29 MED ORDER — ZIPRASIDONE MESYLATE 20 MG IM SOLR: 20.0000 mg | Freq: Two times a day (BID) | INTRAMUSCULAR | Status: DC | PRN

## 2019-10-29 MED ORDER — ONDANSETRON HCL 4 MG PO TABS: 4.0000 mg | ORAL_TABLET | Freq: Three times a day (TID) | ORAL | Status: DC | PRN
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
## 2019-10-29 NOTE — ED Notes (Signed)
Pt d/c confirmed with Dr. Silverio Lay.

## 2019-10-29 NOTE — ED Notes (Signed)
PT IS QUESTIONING WHY HE IS HERE, ASKING OVER AND OVER HOW LONGS DOES HE HAVE TO STAY HERE.

## 2019-10-29 NOTE — ED Notes (Signed)
TTS monitor at bedside 

## 2019-10-29 NOTE — ED Notes (Signed)
PT RECEIVED DINNER TRAY 

## 2019-10-29 NOTE — ED Notes (Signed)
Pt upset due to wait for TTS. Have messaged Danny to see if he can give estimated time he will evaluate pt.
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
## 2019-10-29 NOTE — Discharge Instructions (Addendum)
You are encouraged to follow up with [facility] to pursue outpatient individual and family therapy.  [facility] [address] [place], [place] [phone]

## 2019-10-29 NOTE — ED Provider Notes (Signed)
  Physical Exam  BP 120/75 (BP Location: Left Arm)   Pulse (!) 104   Temp 98 F (36.7 C) (Oral)   Resp 18   SpO2 100%   Physical Exam  ED Course/Procedures   Clinical Course as of Oct 28 1857  Sun Oct 29, 2019  1304 Labs reviewed   [DR]    Clinical Course User Index [DR] Margarita Grizzle, MD    Procedures  MDM  Patient seen by psych and cleared for discharge. Medically cleared by previous provider    Charlynne Pander, MD 10/29/19 1859
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
## 2020-03-11 ENCOUNTER — Other Ambulatory Visit: Payer: Self-pay

## 2020-03-11 ENCOUNTER — Ambulatory Visit (HOSPITAL_COMMUNITY)
Admission: EM | Admit: 2020-03-11 | Discharge: 2020-03-11 | Disposition: A | Discharge status: Home / Self Care | Payer: HRSA Program | Attending: Urgent Care | Admitting: Urgent Care

## 2020-03-11 ENCOUNTER — Encounter (HOSPITAL_COMMUNITY): Payer: Self-pay | Admitting: Emergency Medicine

## 2020-03-11 DIAGNOSIS — U071 COVID-19: Secondary | ICD-10-CM | POA: Diagnosis not present

## 2020-03-11 DIAGNOSIS — R051 Acute cough: Secondary | ICD-10-CM | POA: Diagnosis present

## 2020-03-11 DIAGNOSIS — B349 Viral infection, unspecified: Secondary | ICD-10-CM

## 2020-03-11 DIAGNOSIS — R059 Cough, unspecified: Secondary | ICD-10-CM

## 2020-03-11 LAB — SARS CORONAVIRUS 2 (TAT 6-24 HRS): SARS Coronavirus 2: POSITIVE — AB

## 2020-03-11 MED ORDER — PROMETHAZINE-DM 6.25-15 MG/5ML PO SYRP: 5.0000 mL | ORAL_SOLUTION | Freq: Every evening | ORAL | 0 refills | Status: DC | PRN

## 2020-03-11 MED ORDER — CETIRIZINE HCL 10 MG PO TABS: 10.0000 mg | ORAL_TABLET | Freq: Every day | ORAL | 0 refills | Status: DC

## 2020-03-11 MED ORDER — PSEUDOEPHEDRINE HCL 60 MG PO TABS: 60.0000 mg | ORAL_TABLET | Freq: Three times a day (TID) | ORAL | 0 refills | Status: DC | PRN

## 2020-03-11 MED ORDER — BENZONATATE 100 MG PO CAPS: 100.0000 mg | ORAL_CAPSULE | Freq: Three times a day (TID) | ORAL | 0 refills | Status: DC | PRN

## 2020-03-11 NOTE — ED Provider Notes (Signed)
  [facility]   MRN: [id number] DOB: 1995-07-22  Subjective:   Aaron Morris is a 25 y.o. male presenting for 1 day history of cough, nausea without vomiting, chills, body aches. Denies history of asthma. No smoking cigarettes.   Denies taking chronic medications.    No Known Allergies  Past Medical History:  Diagnosis Date  . Headache   . Suicidal behavior      History reviewed. No pertinent surgical history.  History reviewed. No pertinent family history.  Social History   Tobacco Use  . Smoking status: Current Some Day Smoker  . Smokeless tobacco: Never Used  Substance Use Topics  . Alcohol use: Not Currently  . Drug use: Not Currently    Types: Marijuana    ROS   Objective:   Vitals: BP 107/74 (BP Location: Left Arm)   Pulse 87   Temp 99.7 F (37.6 C) (Oral)   Resp 18   SpO2 97%   Physical Exam Constitutional:      General: He is not in acute distress.    Appearance: Normal appearance. He is well-developed. He is not ill-appearing, toxic-appearing or diaphoretic.  HENT:     Head: Normocephalic and atraumatic.     Right Ear: External ear normal.     Left Ear: External ear normal.     Nose: Nose normal.     Mouth/Throat:     Mouth: Mucous membranes are moist.     Pharynx: Oropharynx is clear.  Eyes:     General: No scleral icterus.       Right eye: No discharge.        Left eye: No discharge.     Extraocular Movements: Extraocular movements intact.     Conjunctiva/sclera: Conjunctivae normal.     Pupils: Pupils are equal, round, and reactive to light.  Cardiovascular:     Rate and Rhythm: Normal rate and regular rhythm.     Heart sounds: Normal heart sounds. No murmur heard. No friction rub. No gallop.   Pulmonary:     Effort: Pulmonary effort is normal. No respiratory distress.     Breath sounds: Normal breath sounds. No stridor. No wheezing, rhonchi or rales.  Neurological:     Mental Status: He is alert and oriented to  person, place, and time.  Psychiatric:        Mood and Affect: Mood normal.        Behavior: Behavior normal.        Thought Content: Thought content normal.        Judgment: Judgment normal.     Assessment and Plan :   PDMP not reviewed this encounter.  1. Viral syndrome   2. Cough     Will manage for viral illness such as viral URI, viral syndrome, viral rhinitis, COVID-19. Counseled patient on nature of COVID-19 including modes of transmission, diagnostic testing, management and supportive care.  Offered scripts for symptomatic relief. COVID 19 testing is pending. Counseled patient on potential for adverse effects with medications prescribed/recommended today, ER and return-to-clinic precautions discussed, patient verbalized understanding.     Wallis Bamberg, [place] 03/11/20 [phone]

## 2020-03-11 NOTE — Discharge Instructions (Addendum)

## 2020-03-11 NOTE — ED Triage Notes (Signed)
Pt presents with cough, nausea, chills, and feel like "brain is empty". States symptoms started yesterday.

## 2020-03-13 ENCOUNTER — Telehealth: Payer: Self-pay | Admitting: *Deleted

## 2020-03-13 NOTE — Telephone Encounter (Signed)
Called to discuss with patient about COVID-19 symptoms and the use of one of the available treatments for those with mild to moderate Covid symptoms and at a high risk of hospitalization.  Pt appears to qualify for outpatient treatment due to co-morbid conditions and/or a member of an at-risk group in accordance with the FDA Emergency Use Authorization.    Symptom onset:  Vaccinated:  Booster?  Immunocompromised?  Qualifiers:   Unable to reach pt - Recording of call cannot be completed at this time-attempted several times.   Karsten Fells

## 2020-03-30 ENCOUNTER — Encounter (HOSPITAL_COMMUNITY): Payer: Self-pay | Admitting: Emergency Medicine

## 2020-03-30 ENCOUNTER — Other Ambulatory Visit: Payer: Self-pay

## 2020-03-30 ENCOUNTER — Emergency Department (HOSPITAL_COMMUNITY)
Admission: EM | Admit: 2020-03-30 | Discharge: 2020-03-30 | Disposition: A | Discharge status: Home / Self Care | Payer: Self-pay | Attending: Emergency Medicine | Admitting: Emergency Medicine

## 2020-03-30 DIAGNOSIS — R110 Nausea: Secondary | ICD-10-CM | POA: Insufficient documentation

## 2020-03-30 DIAGNOSIS — R519 Headache, unspecified: Secondary | ICD-10-CM | POA: Insufficient documentation

## 2020-03-30 DIAGNOSIS — Z5321 Procedure and treatment not carried out due to patient leaving prior to being seen by health care provider: Secondary | ICD-10-CM | POA: Insufficient documentation

## 2020-03-30 NOTE — ED Triage Notes (Signed)
Pt to triage via [facility] from home.  Reports history of migraines.  C/o headache x 1 hour.  Took Tylenol and ASA and states pain is getting better.  Also reports nausea.

## 2020-03-30 NOTE — ED Notes (Signed)
Patient states he is leaving. 

## 2020-04-02 ENCOUNTER — Ambulatory Visit (HOSPITAL_COMMUNITY)
Admission: EM | Admit: 2020-04-02 | Discharge: 2020-04-02 | Disposition: A | Discharge status: Home / Self Care | Payer: Self-pay | Attending: Emergency Medicine | Admitting: Emergency Medicine

## 2020-04-02 ENCOUNTER — Other Ambulatory Visit: Payer: Self-pay

## 2020-04-02 ENCOUNTER — Encounter (HOSPITAL_COMMUNITY): Payer: Self-pay

## 2020-04-02 DIAGNOSIS — R519 Headache, unspecified: Secondary | ICD-10-CM

## 2020-04-02 MED ORDER — KETOROLAC TROMETHAMINE 30 MG/ML IJ SOLN: 30.0000 mg | Freq: Once | INTRAMUSCULAR | Status: DC

## 2020-04-02 MED ORDER — DEXAMETHASONE SODIUM PHOSPHATE 10 MG/ML IJ SOLN
INTRAMUSCULAR | Status: AC
  Filled 2020-04-02: qty 1

## 2020-04-02 MED ORDER — KETOROLAC TROMETHAMINE 30 MG/ML IJ SOLN
INTRAMUSCULAR | Status: AC
  Filled 2020-04-02: qty 1

## 2020-04-02 MED ORDER — DEXAMETHASONE SODIUM PHOSPHATE 10 MG/ML IJ SOLN
10.0000 mg | Freq: Once | INTRAMUSCULAR | Status: AC
  Administered 2020-04-02: 10 mg via INTRAMUSCULAR

## 2020-04-02 NOTE — ED Provider Notes (Addendum)
[facility]    CSN: [id number] Arrival date & time: 04/02/20  0935      History   Chief Complaint Chief Complaint  Patient presents with  . Migraine    HPI Aaron Morris Due is a 25 y.o. male.   Patient presents with headache x3 hours.  He reports tearing and mildly blurred vision his right eye.  He took aspirin and ibuprofen this morning.  He denies photosensitivity, nausea, vomiting, dizziness, weakness, numbness, or other symptoms.  He states he has had migraine headaches since 2019; he was seen by a neurologist in 2019 but has not followed up since then.  His medical history includes migraine headaches and suicidal behavior.  The history is provided by the patient and medical records.    Past Medical History:  Diagnosis Date  . Headache   . Suicidal behavior     There are no problems to display for this patient.   History reviewed. No pertinent surgical history.     Home Medications    Prior to Admission medications   Medication Sig Start Date End Date Taking? Authorizing Provider  benzonatate (TESSALON) 100 MG capsule Take 1-2 capsules (100-200 mg total) by mouth 3 (three) times daily as needed for cough. 03/11/20   Wallis Bamberg, PA-C  cetirizine (ZYRTEC ALLERGY) 10 MG tablet Take 1 tablet (10 mg total) by mouth daily. 03/11/20   Wallis Bamberg, PA-C  promethazine-dextromethorphan (PROMETHAZINE-DM) 6.25-15 MG/5ML syrup Take 5 mLs by mouth at bedtime as needed for cough. 03/11/20   Wallis Bamberg, PA-C  pseudoephedrine (SUDAFED) 60 MG tablet Take 1 tablet (60 mg total) by mouth every 8 (eight) hours as needed for congestion. 03/11/20   Wallis Bamberg, PA-C  rizatriptan (MAXALT-MLT) 10 MG disintegrating tablet Take 1 tablet (10 mg total) by mouth as needed for migraine. May repeat in 2 hours if needed 05/24/17 06/14/19  Penumalli, Glenford Bayley, MD    Family History History reviewed. No pertinent family history.  Social History Social History   Tobacco Use  . Smoking status:  Current Some Day Smoker  . Smokeless tobacco: Never Used  Substance Use Topics  . Alcohol use: Not Currently  . Drug use: Not Currently    Types: Marijuana     Allergies   Patient has no known allergies.   Review of Systems Review of Systems  Constitutional: Negative for chills and fever.  HENT: Negative for ear pain and sore throat.   Eyes: Positive for discharge and visual disturbance. Negative for photophobia and pain.  Respiratory: Negative for cough and shortness of breath.   Cardiovascular: Negative for chest pain and palpitations.  Gastrointestinal: Negative for abdominal pain and vomiting.  Genitourinary: Negative for dysuria and hematuria.  Musculoskeletal: Negative for arthralgias and back pain.  Skin: Negative for color change and rash.  Neurological: Positive for headaches. Negative for dizziness, syncope, weakness and numbness.  All other systems reviewed and are negative.    Physical Exam Triage Vital Signs ED Triage Vitals  Enc Vitals Group     BP      Pulse      Resp      Temp      Temp src      SpO2      Weight      Height      Head Circumference      Peak Flow      Pain Score      Pain Loc      Pain Edu?  Excl. in GC?    No data found.  Updated Vital Signs BP (!) 127/92 (BP Location: Right Arm)   Pulse (!) 57   Temp 99.6 F (37.6 C) (Temporal)   Resp 17   SpO2 98%   Visual Acuity Right Eye Distance:   Left Eye Distance:   Bilateral Distance:    Right Eye Near:   Left Eye Near:    Bilateral Near:     Physical Exam Vitals and nursing note reviewed.  Constitutional:      General: He is not in acute distress.    Appearance: He is well-developed and well-nourished. He is not ill-appearing.  HENT:     Head: Normocephalic and atraumatic.     Mouth/Throat:     Mouth: Mucous membranes are moist.  Eyes:     Conjunctiva/sclera: Conjunctivae normal.  Cardiovascular:     Rate and Rhythm: Normal rate and regular rhythm.     Heart  sounds: Normal heart sounds.  Pulmonary:     Effort: Pulmonary effort is normal. No respiratory distress.     Breath sounds: Normal breath sounds.  Abdominal:     Palpations: Abdomen is soft.     Tenderness: There is no abdominal tenderness.  Musculoskeletal:        General: No edema.     Cervical back: Neck supple.  Skin:    General: Skin is warm and dry.  Neurological:     General: No focal deficit present.     Mental Status: He is alert and oriented to person, place, and time.     Cranial Nerves: No cranial nerve deficit.     Sensory: No sensory deficit.     Motor: No weakness.     Coordination: Romberg sign negative.     Gait: Gait normal.  Psychiatric:        Mood and Affect: Mood and affect and mood normal.        Behavior: Behavior normal.      UC Treatments / Results  Labs (all labs ordered are listed, but only abnormal results are displayed) Labs Reviewed - No data to display  EKG   Radiology No results found.  Procedures Procedures (including critical care time)  Medications Ordered in UC Medications  dexamethasone (DECADRON) injection 10 mg (10 mg Intramuscular Given 04/02/20 1141)    Initial Impression / Assessment and Plan / UC Course  I have reviewed the triage vital signs and the nursing notes.  Pertinent labs & imaging results that were available during my care of the patient were reviewed by me and considered in my medical decision making (see chart for details).   Acute non-intractable headache.  Patient is well-appearing and his exam is reassuring.  Treated with dexamethasone and ketorolac injections.  Instructed patient to follow-up with his neurologist.  He agrees to plan of care.    Update: patient declines ketorolac injection.    Final Clinical Impressions(s) / UC Diagnoses   Final diagnoses:  Acute nonintractable headache, unspecified headache type     Discharge Instructions     You were given dexamethasone and ketorolac today  for your headache.    Schedule an appointment with your neurologist for follow-up.        ED Prescriptions    None     I have reviewed the PDMP during this encounter.   Mickie Bail, NP 04/02/20 1123    Mickie Bail, NP 04/02/20 1146

## 2020-04-02 NOTE — Discharge Instructions (Addendum)
You were given dexamethasone and ketorolac today for your headache.    Schedule an appointment with your neurologist for follow-up.

## 2020-04-02 NOTE — ED Triage Notes (Signed)
Pt presents with Chronic headaches. Pt states he has been given multiple medications and states none have worked. He states he has taken migraine medicine that has also not worked. Pt states he took aspirin and ibuprofen before arriving. Pt states he went to Neurologist and was given medication that he ended up taking 7 times in one day. Pt states he believes he was also given opioids.

## 2020-04-03 NOTE — Progress Notes (Signed)
NEUROLOGY FOLLOW UP OFFICE NOTE  Aaron Morris [id number]  Assessment/Plan:   Episodic cluster headache  1.  Will treat with extended prednisone taper to break cluster 2.  For headache rescue, will prescribe sumatriptan 6mg  [place].  If not affordable with GoodRx, will prescribe 100mg  tablet. 3.  If cluster not broken with the prednisone taper, would start verapamil 80mg  three times daily.  Would get baseline EKG prior to initiating. 4.  He has no established PCP.  Will refer him to [facility], Dr. 5.  Follow up 4 months.  Subjective:  Aaron Morris is a 25 year old right-handed male who follows up for cluster headaches.  UPDATE: Seen him once in 2019 for cluster headache.  However, he reported that he was moving to [place] the following month, so he had no follow up.  He has since moved back to [facility]  Current cluster started earlier this month  Hydrocodone, acetaminophen and ibuprofen ineffective.  He was seen in UC yesterday where he was treated with Decadron (possibly also ketorolac), which broke the headache.   Intensity:  severe Duration:  3 hours Frequency:  3 times a day Frequency of abortive medication: none Current NSAIDS:  no Current analgesics:  no Current triptans:  no Current ergotamine:  no Current anti-emetic:  no Current muscle relaxants:  no Current anti-anxiolytic:  no Current sleep aide:  no Current Antihypertensive medications:  no Current Antidepressant medications:  no Current Anticonvulsant medications:  no Current anti-CGRP:  no Current Vitamins/Herbal/Supplements:  no Current Antihistamines/Decongestants:  no Other therapy:  no Other medication:  no  Caffeine:  No coffee, tea, soda Alcohol:  no Smoker:  yes Diet:  Hydrates.   Exercise:  yes Depression/Anxiety:  History of suicidal ideation. Other pain:  no Sleep hygiene:  good  HISTORY: Onset:  2015 Location:  Right  retro-orbital/periorbital/temporal Quality:  burning Initial intensity:  Severe.  He denies new headache, thunderclap headache or severe headache that wakes him from sleep. Aura:  no Prodrome:  no Postdrome:  no Associated symptoms:  Some blurred vision in right eye.  Lacrimation in right eye.  Some conjunctival injection in right eye.  He denies associated nausea, vomiting, photophobia, phonophobia, osmophobia, unilateral numbness or weakness. Initial Duration:  3 to 4 hours (but usually falls asleep).  Usually occurs at 4-5 pm. Initial Frequency:  Occurs once a year (Usually March to April) for about 2 months. Triggers:  Skipped meals Exacerbating factors:  no Relieving factors:  Takes pain relievers and goes to sleep Activity:  Aggravates.  He went to the ED on 05/17/17.  CT Head without contrast was personally reviewed and unremarkable.  He followed up with another neurologist who prescribed him indomethacin and Maxalt MLT.  He never tried the Maxalt.  Past NSAIDS:  Indomethacin trial (helped), Advil, Aleve Past analgesics:  Tylenol Past abortive triptans:  rizatriptan Past abortive ergotamine:  no Past muscle relaxants:  no Past anti-emetic:  no Past antihypertensive medications:  no Past antidepressant medications:  no Past anticonvulsant medications:  no Past anti-CGRP:  no Past vitamins/Herbal/Supplements:  no Past antihistamines/decongestants:  no Other past therapies:  no   Family history of headache:  no  PAST MEDICAL HISTORY: Past Medical History:  Diagnosis Date  . Headache   . Suicidal behavior     MEDICATIONS: Current Outpatient Medications on File Prior to Visit  Medication Sig Dispense Refill  . benzonatate (TESSALON) 100 MG capsule Take 1-2 capsules (100-200 mg total) by mouth  3 (three) times daily as needed for cough. 60 capsule 0  . cetirizine (ZYRTEC ALLERGY) 10 MG tablet Take 1 tablet (10 mg total) by mouth daily. 30 tablet 0  .  promethazine-dextromethorphan (PROMETHAZINE-DM) 6.25-15 MG/5ML syrup Take 5 mLs by mouth at bedtime as needed for cough. 100 mL 0  . pseudoephedrine (SUDAFED) 60 MG tablet Take 1 tablet (60 mg total) by mouth every 8 (eight) hours as needed for congestion. 30 tablet 0  . [DISCONTINUED] rizatriptan (MAXALT-MLT) 10 MG disintegrating tablet Take 1 tablet (10 mg total) by mouth as needed for migraine. May repeat in 2 hours if needed 9 tablet 11   No current facility-administered medications on file prior to visit.    ALLERGIES: No Known Allergies  FAMILY HISTORY: No family history on file.    Objective:  Blood pressure 110/73, pulse 65, height 6' (1.829 m), weight 161 lb 9.6 oz (73.3 kg), SpO2 100 %. General: No acute distress.  Patient appears well-groomed.   Head:  Normocephalic/atraumatic Eyes:  Fundi examined but not visualized Neck: supple, no paraspinal tenderness, full range of motion Heart:  Regular rate and rhythm Lungs:  Clear to auscultation bilaterally Back: No paraspinal tenderness Neurological Exam: alert and oriented to person, place, and time. Attention span and concentration intact, recent and remote memory intact, fund of knowledge intact.  Speech fluent and not dysarthric, language intact.  CN II-XII intact. Bulk and tone normal, muscle strength 5/5 throughout.  Sensation to light touch, temperature and vibration intact.  Deep tendon reflexes 2+ throughout, toes downgoing.  Finger to nose and heel to shin testing intact.  Gait normal, Romberg negative.     Shon Millet, DO

## 2020-04-04 ENCOUNTER — Encounter: Payer: Self-pay | Admitting: Neurology

## 2020-04-04 ENCOUNTER — Other Ambulatory Visit: Payer: Self-pay

## 2020-04-04 ENCOUNTER — Ambulatory Visit (INDEPENDENT_AMBULATORY_CARE_PROVIDER_SITE_OTHER): Payer: Self-pay | Admitting: Neurology

## 2020-04-04 VITALS — BP 110/73 | HR 65 | Ht 72.0 in | Wt 161.6 lb

## 2020-04-04 DIAGNOSIS — Z0000 Encounter for general adult medical examination without abnormal findings: Secondary | ICD-10-CM

## 2020-04-04 DIAGNOSIS — G44019 Episodic cluster headache, not intractable: Secondary | ICD-10-CM

## 2020-04-04 MED ORDER — SUMATRIPTAN SUCCINATE 6 MG/0.5ML ~~LOC~~ SOCT: 6.0000 mg | SUBCUTANEOUS | 5 refills | Status: DC | PRN

## 2020-04-04 MED ORDER — PREDNISONE 10 MG PO TABS: ORAL_TABLET | ORAL | 0 refills | Status: DC

## 2020-04-04 NOTE — Patient Instructions (Signed)
1.  Take prednisone taper 10mg  tablet:  Take 6 tablets daily for 3 days, then 5 tablets daily for 3 days, then 4 tablets daily for 3 days, then 3 tablets daily for 3 days, then 2 tablets daily for 3 days, then 1 tablet daily for 3 days, then STOP. 2.  Take sumatriptan injection at earliest onset of headache.  May repeat once after 1 hour if needed.  Maximum 2 injections in 24 hours. 3.  Limit use of pain relievers to no more than 2 days out of week to prevent risk of rebound or medication-overuse headache. 4.  Keep headache diary 5.  Refer to [facility] (Dr. [facility]) 6.  Follow up 4 months

## 2020-04-19 ENCOUNTER — Telehealth: Payer: Self-pay | Admitting: Neurology

## 2020-04-19 ENCOUNTER — Other Ambulatory Visit: Payer: Self-pay | Admitting: Neurology

## 2020-04-19 MED ORDER — VERAPAMIL HCL 80 MG PO TABS: 80.0000 mg | ORAL_TABLET | Freq: Three times a day (TID) | ORAL | 1 refills | Status: DC

## 2020-04-19 NOTE — Telephone Encounter (Signed)
He should finish the prednisone taper as prescribed.  He cannot continue taking prednisone chronically.  If it has not stopped the headaches, then he needs to start a daily medication.  We can start verapamil 80mg  three times daily.  In addition, I would like for him to have a baseline EKG checked.

## 2020-04-19 NOTE — Telephone Encounter (Signed)
Patient is calling to ask if Dr Everlena Cooper is checking for brain swelling since he is taking an anti-inflammatory? Patient states he is taking prednisone, and once he got below 4 pills it stopped working for him, so he has continued taking 4 and is wanting to know if that's ok?  Please call.

## 2020-04-19 NOTE — Telephone Encounter (Signed)
Pt returned our call. Pt advised of the new medication.  Pt states the first available [facility]t that [facility] for [facility] is August.

## 2020-04-19 NOTE — Telephone Encounter (Signed)
Telephone call to pt, Pt mother pick up, Advised pt mother to have the pt to give [place] a call. Per pt mother the headache are more frequent.   Please have the pt give [place] a call so we can let him know the medication and how to take. Pt needs a EKG as well.

## 2020-04-21 ENCOUNTER — Other Ambulatory Visit: Payer: Self-pay

## 2020-04-21 ENCOUNTER — Emergency Department (HOSPITAL_COMMUNITY): Payer: Self-pay

## 2020-04-21 ENCOUNTER — Emergency Department (HOSPITAL_COMMUNITY)
Admission: EM | Admit: 2020-04-21 | Discharge: 2020-04-21 | Disposition: A | Discharge status: Home / Self Care | Payer: Self-pay | Attending: Emergency Medicine | Admitting: Emergency Medicine

## 2020-04-21 ENCOUNTER — Encounter (HOSPITAL_COMMUNITY): Payer: Self-pay | Admitting: Emergency Medicine

## 2020-04-21 DIAGNOSIS — R519 Headache, unspecified: Secondary | ICD-10-CM | POA: Insufficient documentation

## 2020-04-21 DIAGNOSIS — R531 Weakness: Secondary | ICD-10-CM | POA: Insufficient documentation

## 2020-04-21 DIAGNOSIS — F17200 Nicotine dependence, unspecified, uncomplicated: Secondary | ICD-10-CM | POA: Insufficient documentation

## 2020-04-21 IMAGING — CT CT HEAD W/O CM
3 series · 14 of 47 positions shown, 16 images · non-contrast
Comparison: [DATE]

CLINICAL DATA: Headache.

EXAM:
CT HEAD WITHOUT CONTRAST
TECHNIQUE: Contiguous axial images were obtained from the base of the skull
through the vertex without intravenous contrast.

[Series 2: head wo · axial · 0.47mm/px · z∈[-181,-46]mm · 8 of 33 slices shown, 10 images]
[im 3/33  brain]
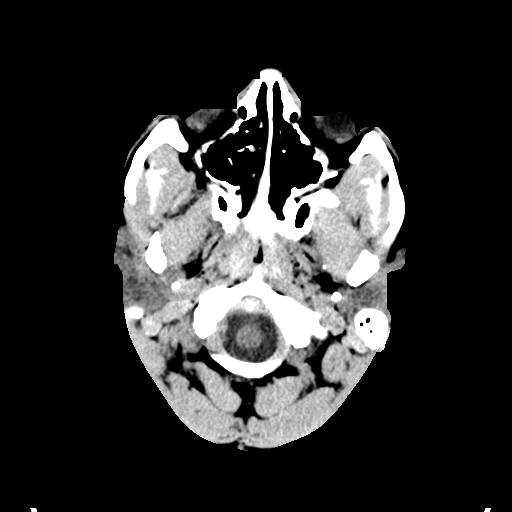
[im 3/33  bone]
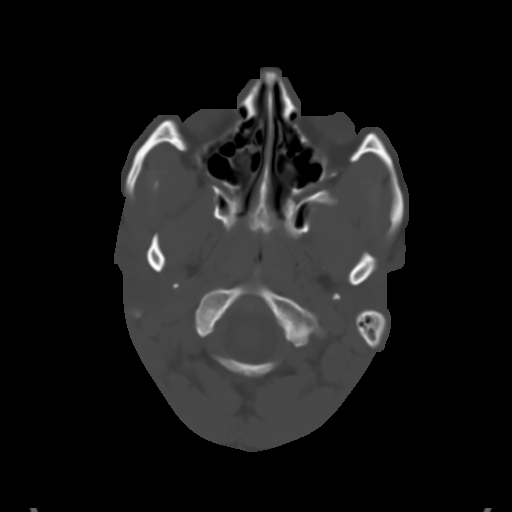
[im 7/33  brain]
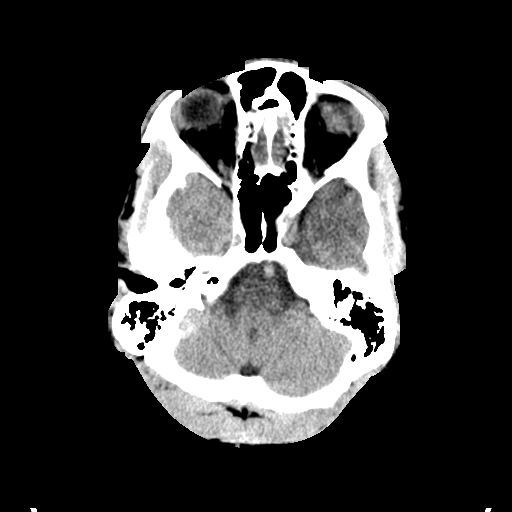
[im 10/33  brain]
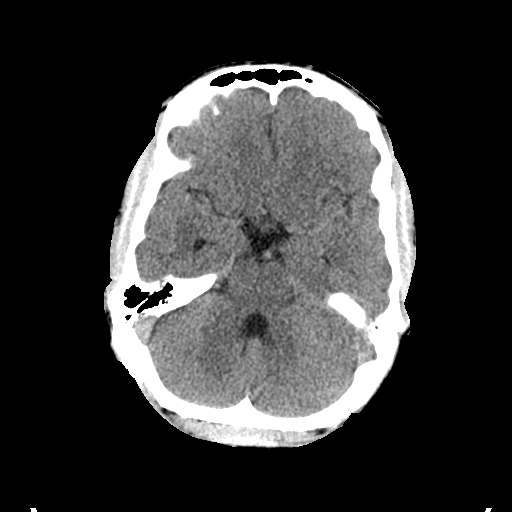
[im 15/33  brain]
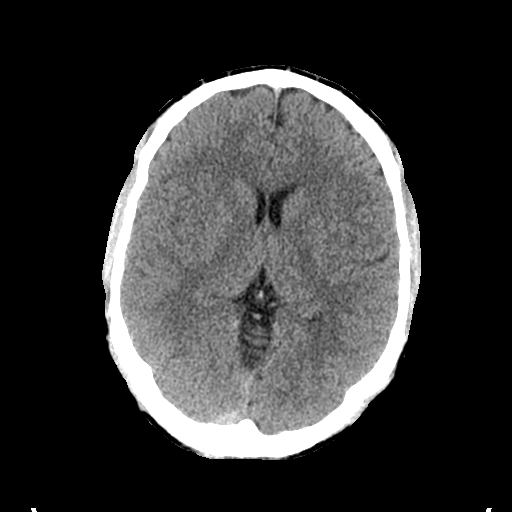
[im 18/33  brain]
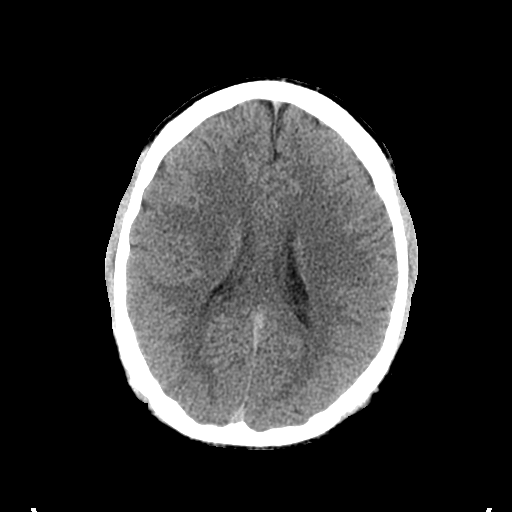
[im 18/33  bone]
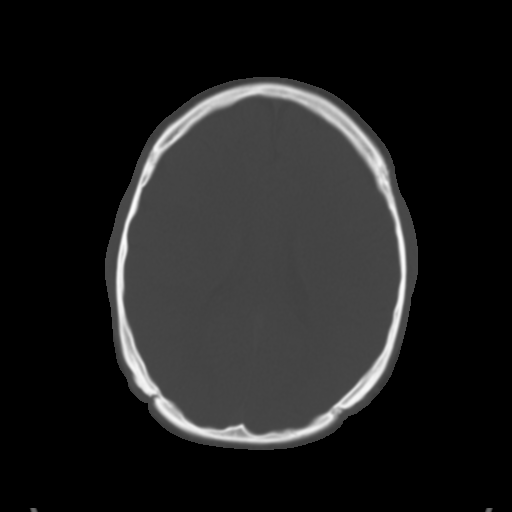
[im 23/33  brain]
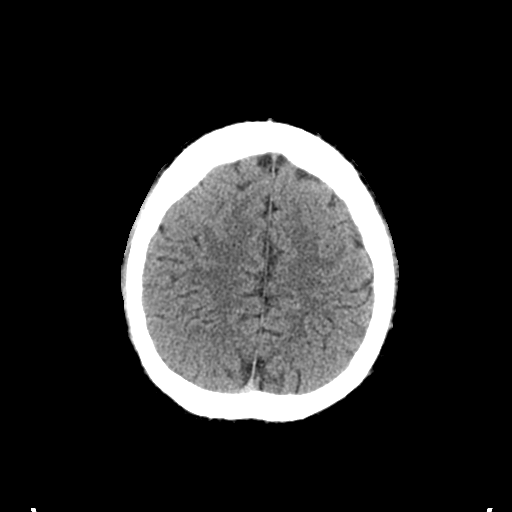
[im 26/33  brain]
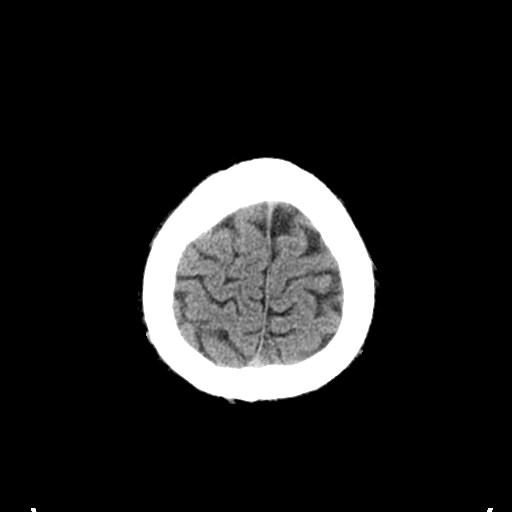
[im 30/33  brain]
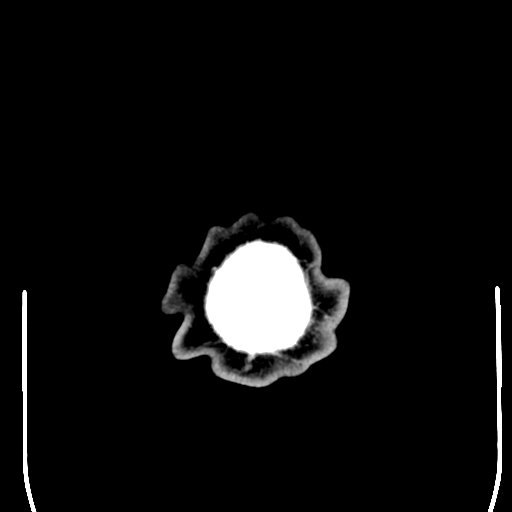

[Series 5: coronal soft tissue · coronal · 0.33mm/px · 3 of 70 slices shown]
[im 24/70  brain]
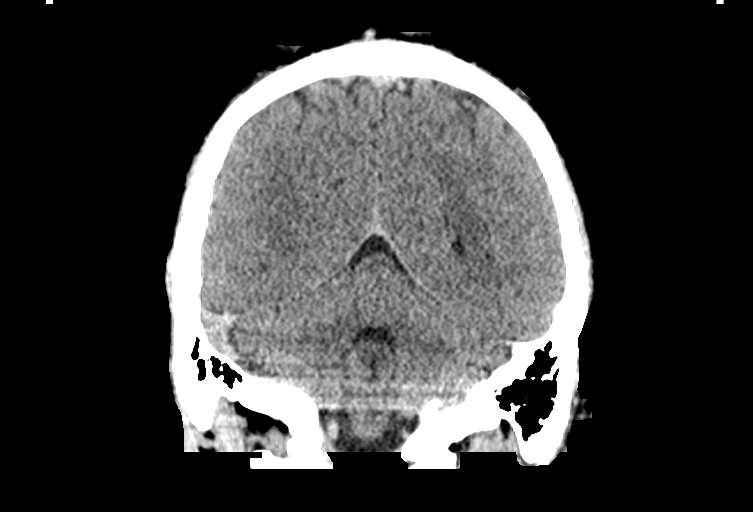
[im 31/70  brain]
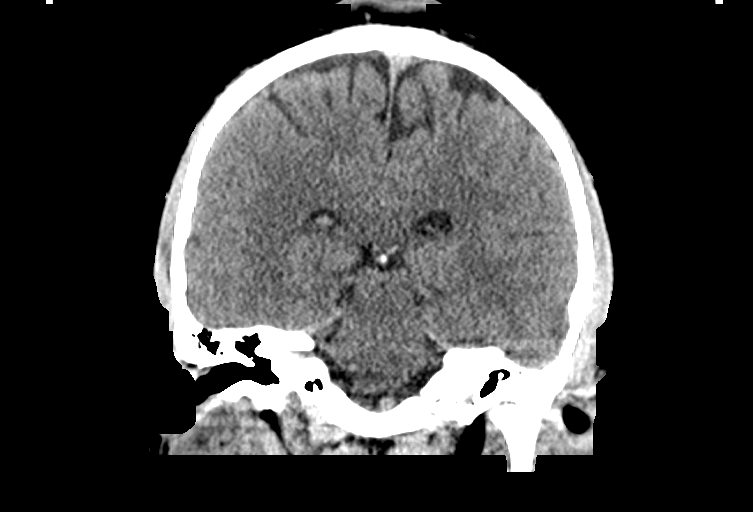
[im 39/70  brain]
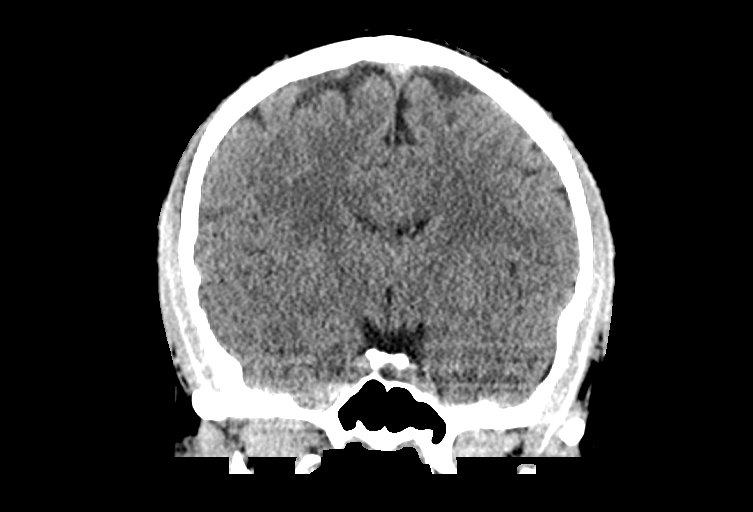

[Series 6: sagittal soft tissue · sagittal · 0.34mm/px · 3 of 59 slices shown]
[im 20/59  brain]
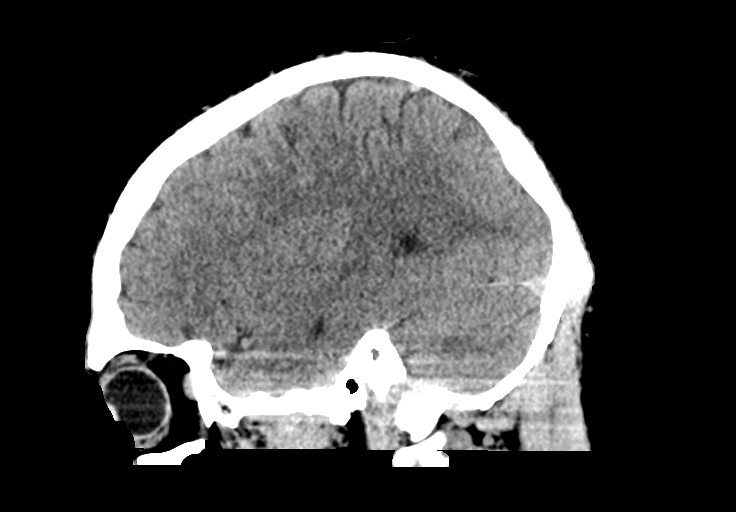
[im 30/59  brain]
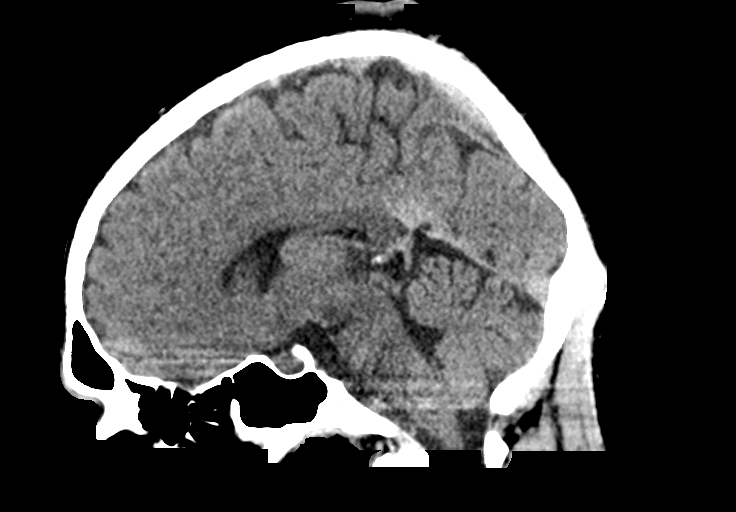
[im 39/59  brain]
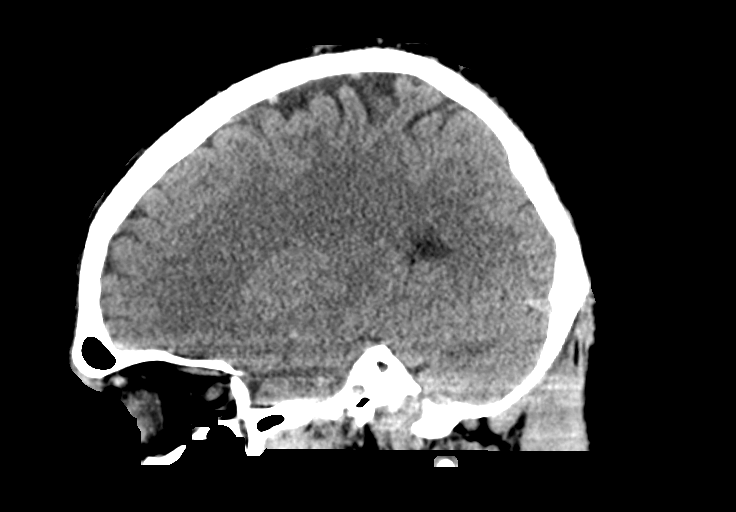

[14 of 47 positions shown; findings below may reference images not displayed]

FINDINGS: Brain: No evidence of acute infarction, hemorrhage, hydrocephalus,
extra-axial collection or mass lesion/mass effect.

Vascular: No hyperdense vessel or unexpected calcification.

Skull: Normal. Negative for fracture or focal lesion.

Sinuses/Orbits: No acute finding.

Other: None.
IMPRESSION: No acute intracranial pathology.

## 2020-04-21 IMAGING — MR MR MRA HEAD W/O CM
2 series · 21 of 48 positions shown · non-contrast
Comparison: None.

CLINICAL DATA: Headache.

EXAM:
MRA HEAD WITHOUT CONTRAST
TECHNIQUE: Angiographic images of the Circle of [place] were obtained using MRA
technique without intravenous contrast.

[Series 5: vessel_scout_head_msum · sagittal · 6.0mm · 0.59mm/px · 5 of 21 slices shown]
[im 1/21]
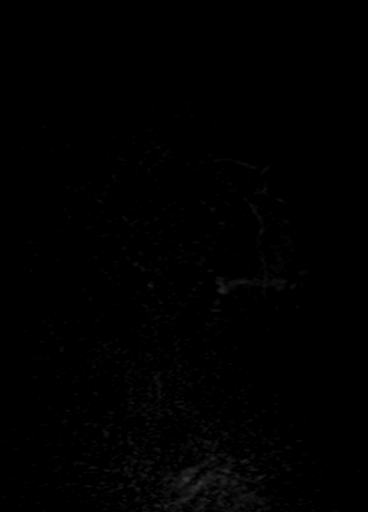
[im 6/21]
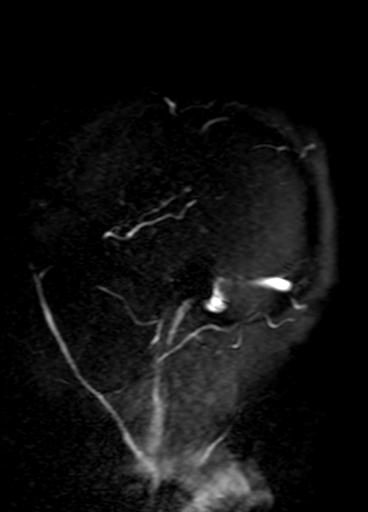
[im 11/21]
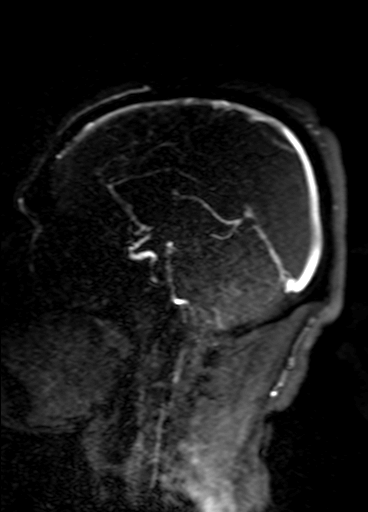
[im 16/21]
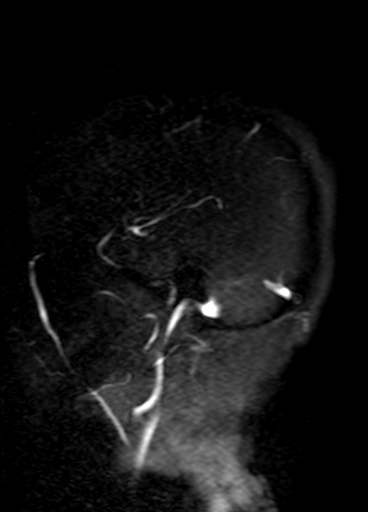
[im 21/21]
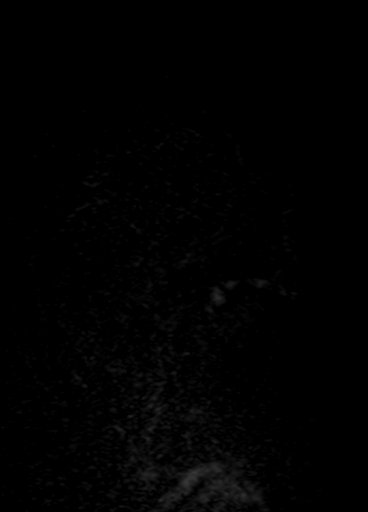

[Series 8: TOF · axial · 0.6mm · 0.35mm/px · z∈[-74,+23]mm · 16 of 172 slices shown]
[im 1/172]
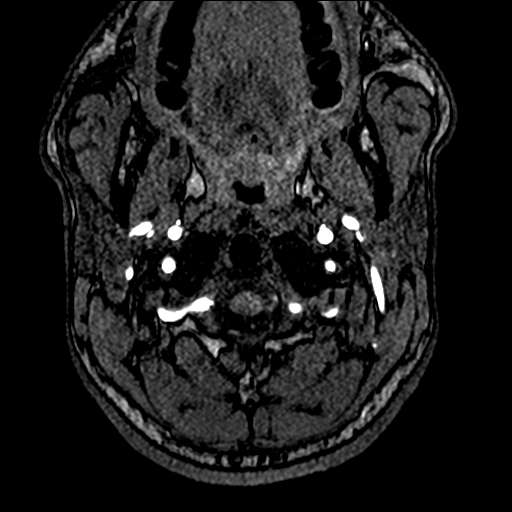
[im 5/172]
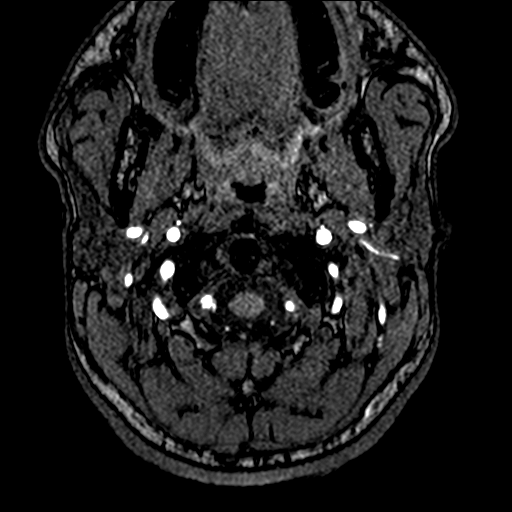
[im 9/172]
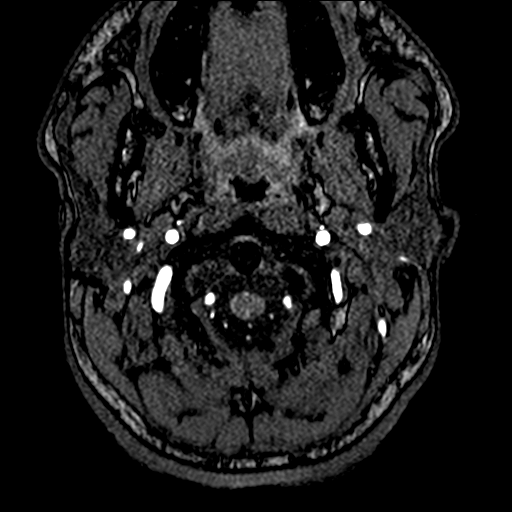
[im 13/172]
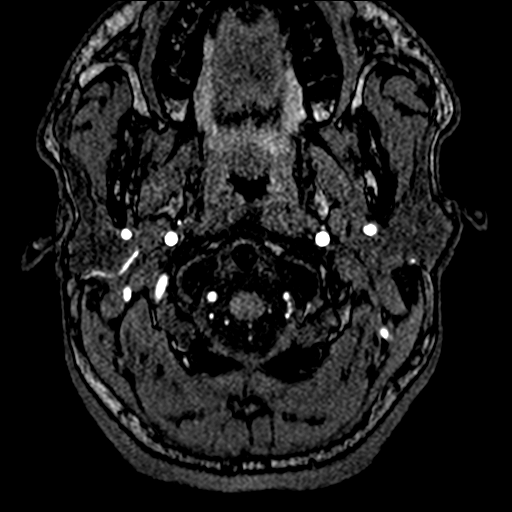
[im 17/172]
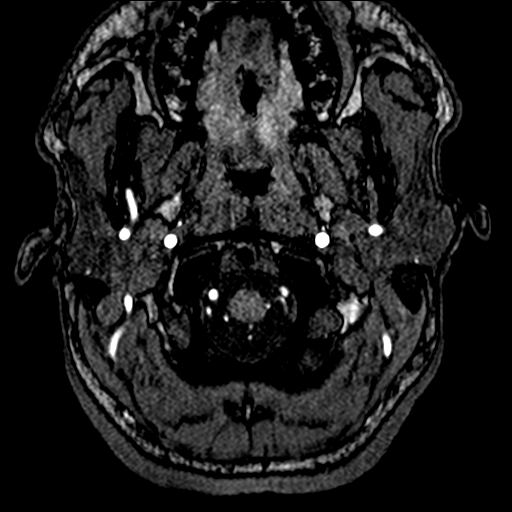
[im 21/172]
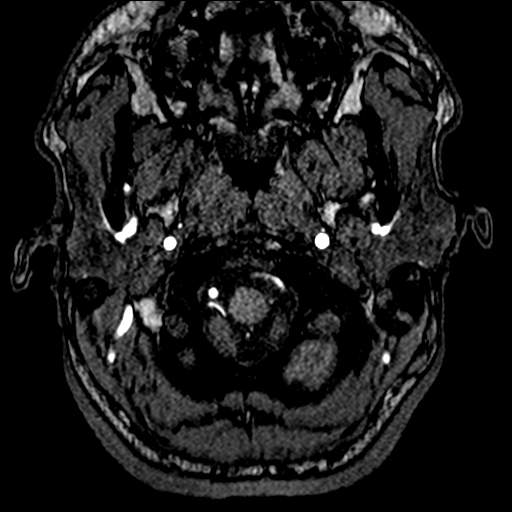
[im 29/172]
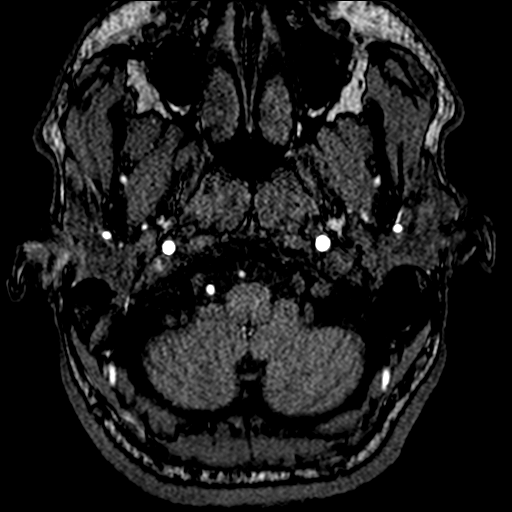
[im 33/172]
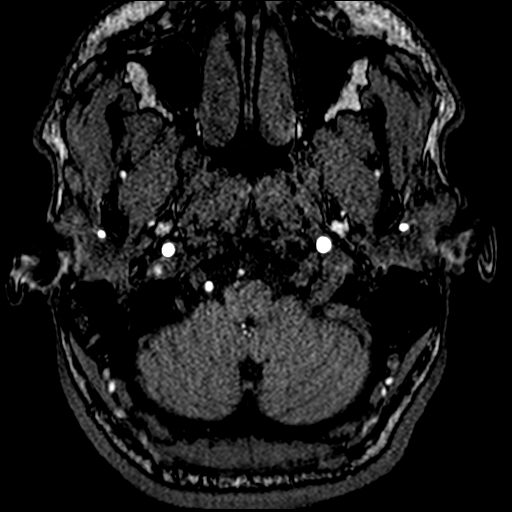
[im 53/172]
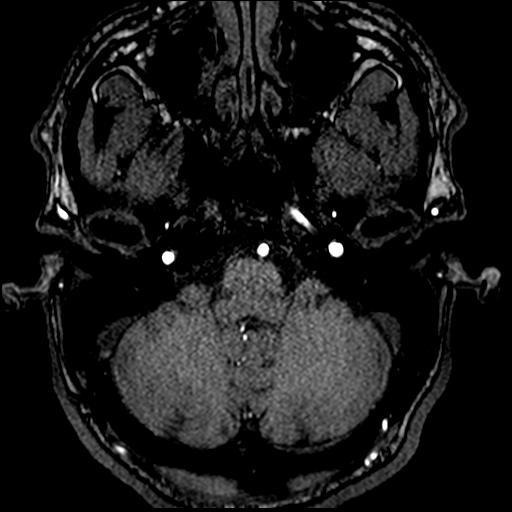
[im 74/172]
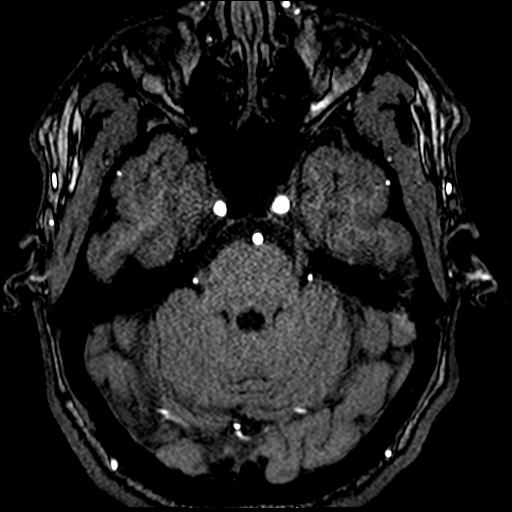
[im 86/172]
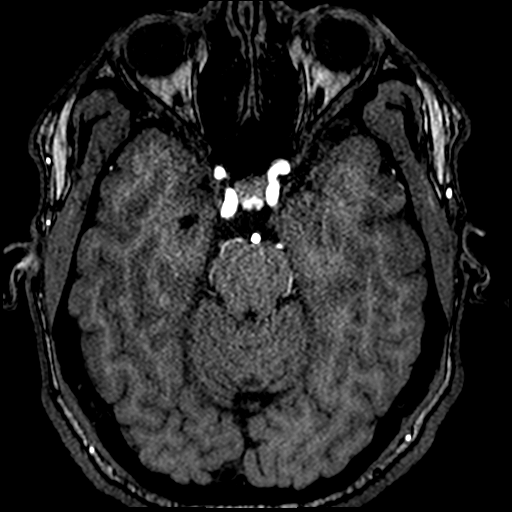
[im 98/172]
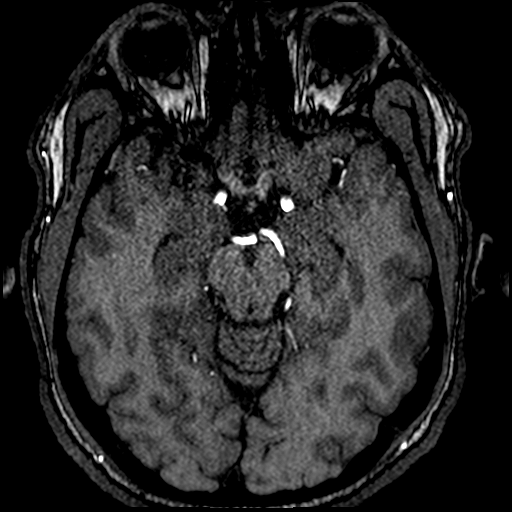
[im 119/172]
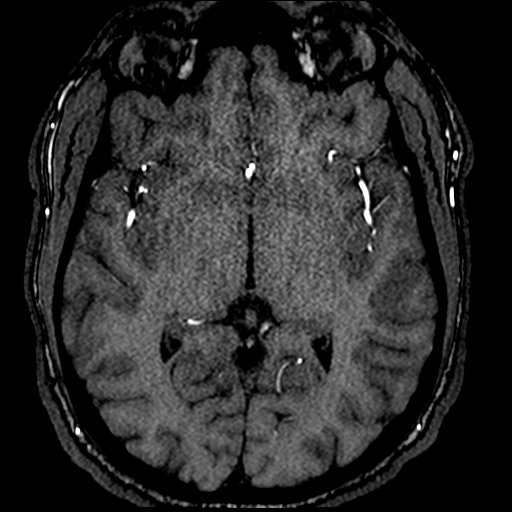
[im 139/172]
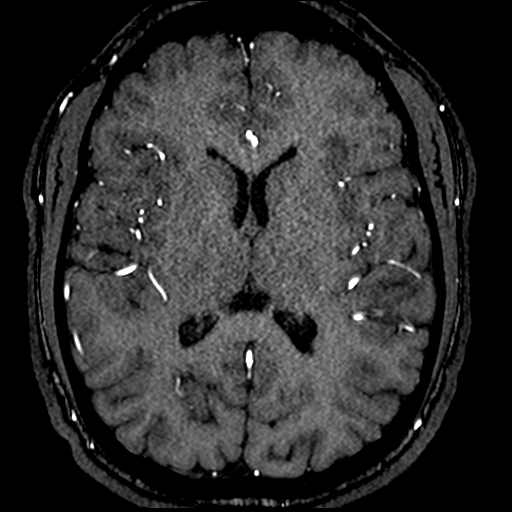
[im 143/172]
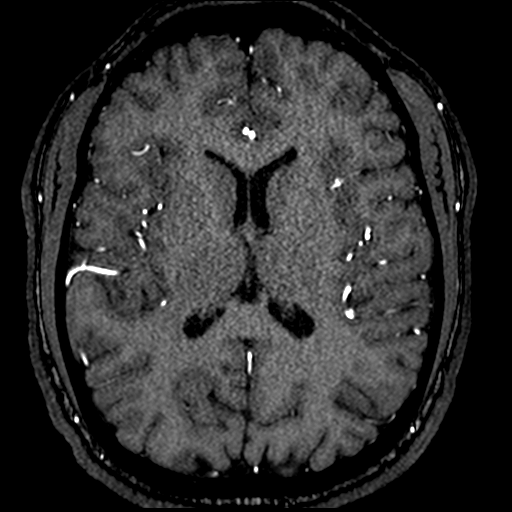
[im 163/172]
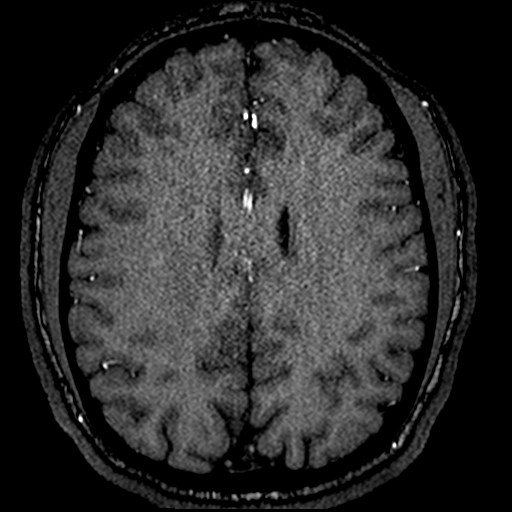

[21 of 48 positions shown; findings below may reference images not displayed]

FINDINGS: The visualized distal vertebral arteries are patent to the basilar
with the right being strongly dominant. Patent PICAs and SCAs are
seen bilaterally. The basilar artery is widely patent. There are
left larger than right posterior communicating arteries. Both PCAs
are patent without evidence of a significant proximal stenosis.

The internal carotid arteries are widely patent from skull base to
carotid termini. ACAs and MCAs are patent without evidence of a
proximal branch occlusion or significant proximal stenosis. The
right A1 segment is hypoplastic, a normal variant. No aneurysm is
identified.
IMPRESSION: Negative head MRA.

## 2020-04-21 IMAGING — MR MR HEAD W/O CM
11 series · 48 of 48 positions shown · non-contrast
Comparison: None.

CLINICAL DATA: Headache

EXAM:
MRI HEAD WITHOUT CONTRAST
TECHNIQUE: Multiplanar, multiecho pulse sequences of the brain and surrounding
structures were obtained without intravenous contrast.

[Series 5: DWI · axial · 3.0mm · 1.36mm/px · z∈[-34,+119]mm · 8 of 104 slices shown (1 of 2)]
[im 1/104]
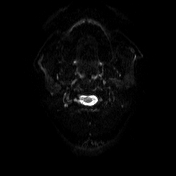
[im 15/104]
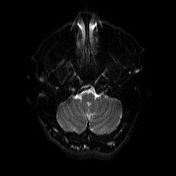
[im 30/104]
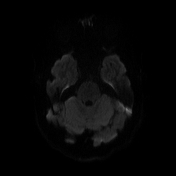
[im 45/104]
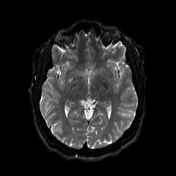
[im 59/104]
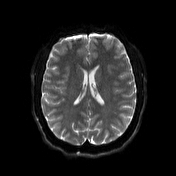
[im 74/104]
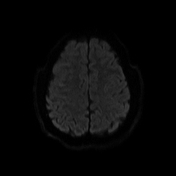
[im 89/104]
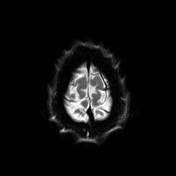
[im 104/104]
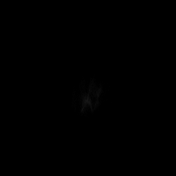

[Series 6: DWI · axial · 3.0mm · 1.36mm/px · z∈[-34,+119]mm · 4 of 50 slices shown (2 of 2)]
[im 1/50]
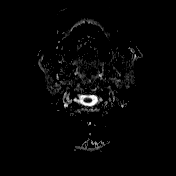
[im 17/50]
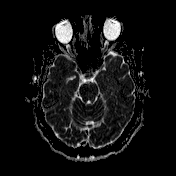
[im 33/50]
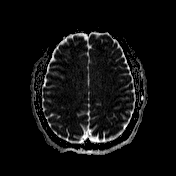
[im 50/50]
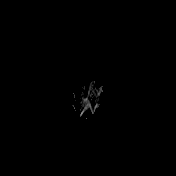

[Series 7: T1 · sagittal · 5.0mm · 0.75mm/px · 2 of 27 slices shown (1 of 2)]
[im 1/27]
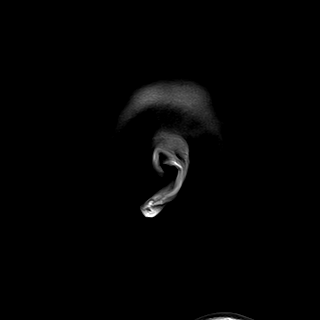
[im 27/27]
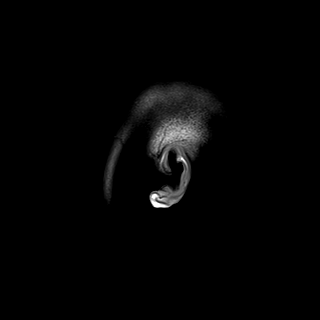

[Series 8: T2 · axial · 5.0mm · 0.62mm/px · z∈[-45,+117]mm · 2 of 26 slices shown (1 of 2)]
[im 1/26]
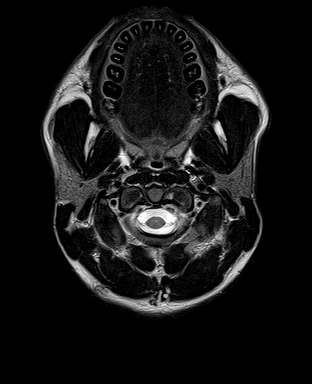
[im 26/26]
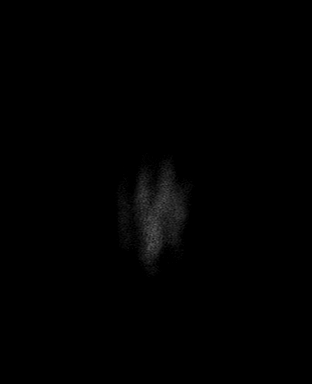

[Series 9: mip_images(sw) · axial · 24.0mm · 0.75mm/px · z∈[-42,+113]mm · 4 of 53 slices shown]
[im 1/53]
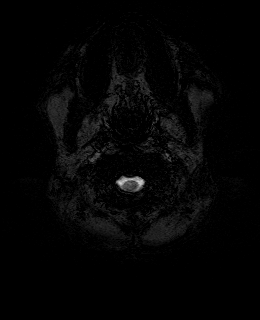
[im 18/53]
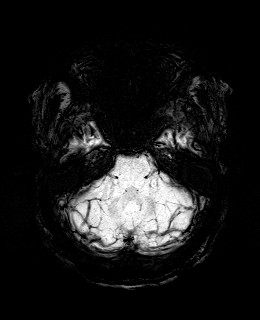
[im 35/53]
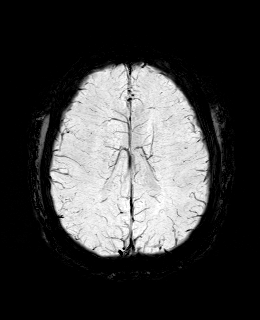
[im 53/53]
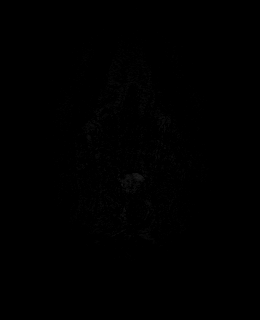

[Series 10: swi_images · axial · 3.0mm · 0.75mm/px · z∈[-52,+124]mm · 4 of 60 slices shown]
[im 1/60]
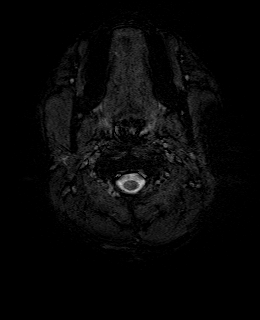
[im 20/60]
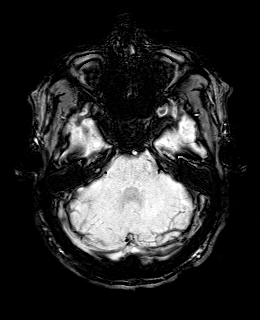
[im 40/60]
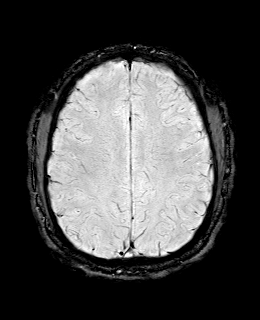
[im 60/60]
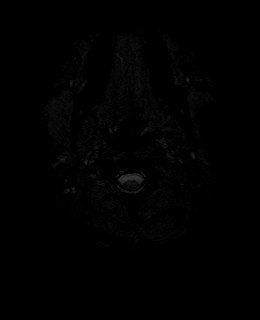

[Series 11: FLAIR · axial · 3.0mm · 0.75mm/px · z∈[-42,+113]mm · 4 of 53 slices shown]
[im 1/53]
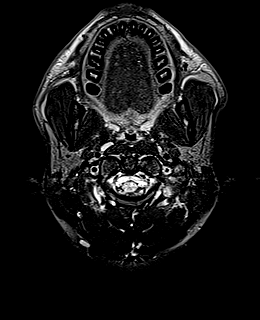
[im 18/53]
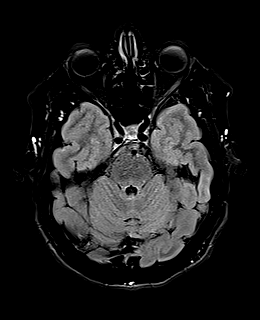
[im 35/53]
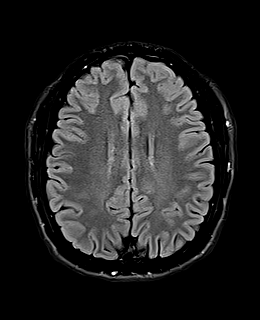
[im 53/53]
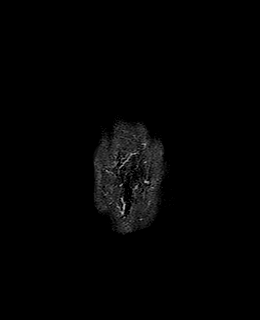

[Series 12: T1 · axial · 1.0mm · 0.94mm/px · z∈[-38,+121]mm · 12 of 160 slices shown (2 of 2)]
[im 1/160]
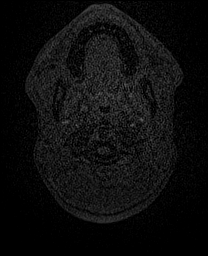
[im 15/160]
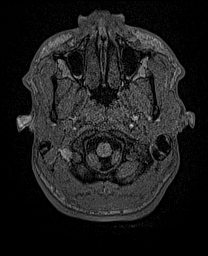
[im 29/160]
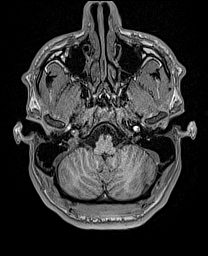
[im 44/160]
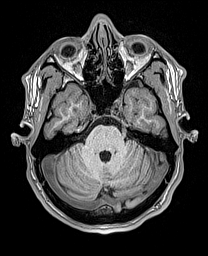
[im 58/160]
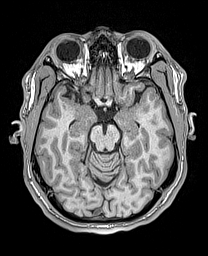
[im 73/160]
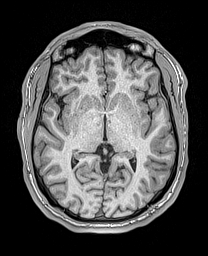
[im 87/160]
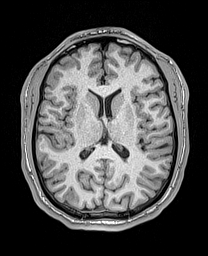
[im 102/160]
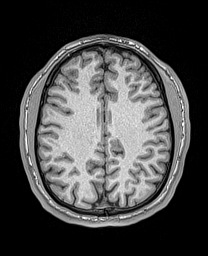
[im 116/160]
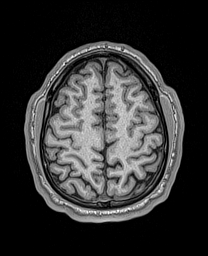
[im 131/160]
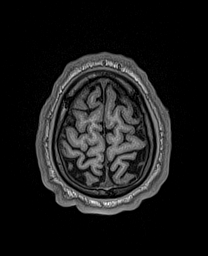
[im 145/160]
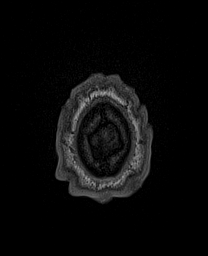
[im 160/160]
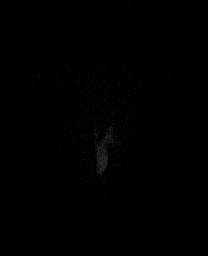

[Series 13: cor dwi_tracew · coronal · 5.0mm · 1.53mm/px · 4 of 56 slices shown]
[im 1/56]
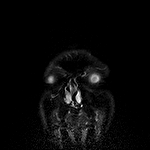
[im 19/56]
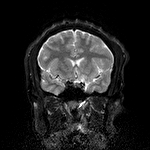
[im 37/56]
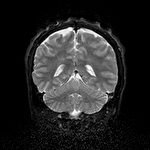
[im 56/56]
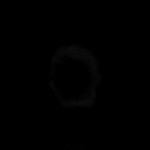

[Series 14: cor dwi_adc · coronal · 5.0mm · 1.53mm/px · 2 of 28 slices shown]
[im 1/28]
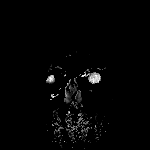
[im 28/28]
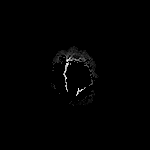

[Series 15: T2 · coronal · 5.0mm · 0.57mm/px · 2 of 28 slices shown (2 of 2)]
[im 1/28]
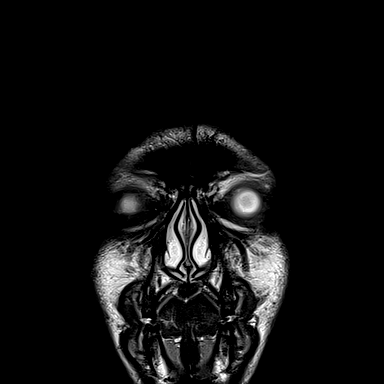
[im 28/28]
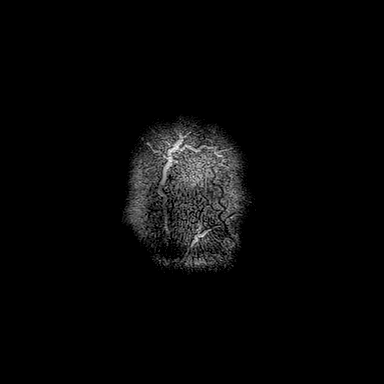

[48 of 48 positions shown; findings below may reference images not displayed]

FINDINGS: Brain: No acute infarct, acute hemorrhage or extra-axial collection.
Normal white matter signal. Normal volume of CSF spaces. No chronic
microhemorrhage. Normal midline structures.

Vascular: Major flow voids are preserved.

Skull and upper cervical spine: Normal calvarium and skull base.
Visualized upper cervical spine and soft tissues are normal.

Sinuses/Orbits:No paranasal sinus fluid levels or advanced mucosal
thickening. No mastoid or middle ear effusion. Normal orbits.
IMPRESSION: Normal brain MRI.

## 2020-04-21 MED ORDER — KETOROLAC TROMETHAMINE 30 MG/ML IJ SOLN
60.0000 mg | Freq: Once | INTRAMUSCULAR | Status: AC
  Administered 2020-04-21: 60 mg via INTRAMUSCULAR
  Filled 2020-04-21: qty 2

## 2020-04-21 NOTE — ED Provider Notes (Signed)
[facility]-EMERGENCY DEPT Provider Note   CSN: [id number] Arrival date & time: 04/21/20  1500     History Chief Complaint  Patient presents with  . Headache    Aaron Morris is a 25 y.o. male.  The patient has had daily headaches for years.  He has seen neurology, and he has been diagnosed with cluster headaches.  He has also been treated with migraine meds.  He states that today he had the sudden onset of a severe but typical headache.  For about a week, he has had difficulty with his left arm.  He states that it is very subtle, but he feels it is slightly weaker than his opposite arm.  He requests another CT scan.  Last scan was in 2019.  The history is provided by the patient.  Headache Location: right orbit. Quality:  Sharp Radiates to:  Does not radiate Severity currently:  10/10 Severity at highest:  10/10 Onset quality:  Sudden Duration:  1 hour Timing:  Constant Progression:  Unchanged Chronicity:  Recurrent Similar to prior headaches: yes   Context comment:  No obvious triggers Relieved by:  Nothing Worsened by:  Nothing Ineffective treatments: verapamil and multiple other medications in the past. Associated symptoms: focal weakness (feels like his left arm is not as coordinated or strong as right for one week)   Associated symptoms: no abdominal pain, no back pain, no blurred vision, no cough, no ear pain, no eye pain, no fever, no loss of balance, no neck stiffness, no numbness, no paresthesias, no photophobia, no seizures, no sore throat, no visual change and no vomiting        Past Medical History:  Diagnosis Date  . Headache   . Suicidal behavior     There are no problems to display for this patient.   History reviewed. No pertinent surgical history.     No family history on file.  Social History   Tobacco Use  . Smoking status: Current Some Day Smoker  . Smokeless tobacco: Never Used  Substance Use Topics  . Alcohol use:  Not Currently  . Drug use: Not Currently    Types: Marijuana    Home Medications Prior to Admission medications   Medication Sig Start Date End Date Taking? Authorizing Provider  benzonatate (TESSALON) 100 MG capsule Take 1-2 capsules (100-200 mg total) by mouth 3 (three) times daily as needed for cough. Patient not taking: Reported on 04/04/2020 03/11/20   Wallis Bamberg, PA-C  cetirizine (ZYRTEC ALLERGY) 10 MG tablet Take 1 tablet (10 mg total) by mouth daily. Patient not taking: Reported on 04/04/2020 03/11/20   Wallis Bamberg, PA-C  predniSONE (DELTASONE) 10 MG tablet Take 60mg  daily x 3 days, then 50mg  daily x 3 days, then 40mg  daily x 3 days, then 30mg  daily x 3 days, then 20mg  daily x 3 days, then 10mg  daily x 3 days, then STOP 04/04/20   Jaffe, Adam R, DO  promethazine-dextromethorphan (PROMETHAZINE-DM) 6.25-15 MG/5ML syrup Take 5 mLs by mouth at bedtime as needed for cough. Patient not taking: Reported on 04/04/2020 03/11/20   , PA-C  pseudoephedrine (SUDAFED) 60 MG tablet Take 1 tablet (60 mg total) by mouth every 8 (eight) hours as needed for congestion. Patient not taking: Reported on 04/04/2020 03/11/20   04-13-1989, PA-C  SUMAtriptan Succinate 6 MG/0.5ML SOCT Inject 6 mg into the skin as needed (May repeat in 1 hour.  Maximum 2 injections in 24 hours). 04/04/20   Wallis Bamberg  R, DO  verapamil (CALAN) 80 MG tablet Take 1 tablet (80 mg total) by mouth 3 (three) times daily. 04/19/20   Drema Dallas, DO  rizatriptan (MAXALT-MLT) 10 MG disintegrating tablet Take 1 tablet (10 mg total) by mouth as needed for migraine. May repeat in 2 hours if needed 05/24/17 06/14/19  Penumalli, Glenford Bayley, MD    Allergies    Patient has no known allergies.  Review of Systems   Review of Systems  Constitutional: Negative for chills and fever.  HENT: Negative for ear pain and sore throat.   Eyes: Negative for blurred vision, photophobia, pain and visual disturbance.  Respiratory: Negative for cough and  shortness of breath.   Cardiovascular: Negative for chest pain and palpitations.  Gastrointestinal: Negative for abdominal pain and vomiting.  Genitourinary: Negative for dysuria and hematuria.  Musculoskeletal: Negative for arthralgias, back pain and neck stiffness.  Skin: Negative for color change and rash.  Neurological: Positive for focal weakness (feels like his left arm is not as coordinated or strong as right for one week) and headaches. Negative for seizures, syncope, numbness, paresthesias and loss of balance.  All other systems reviewed and are negative.   Physical Exam Updated Vital Signs BP 119/68 (BP Location: Left Arm)   Pulse 68   Temp 98.4 F (36.9 C) (Oral)   Resp 18   SpO2 99%   Physical Exam Vitals and nursing note reviewed.  Constitutional:      Appearance: He is well-developed.  HENT:     Head: Normocephalic and atraumatic.     Mouth/Throat:     Mouth: Mucous membranes are moist.  Eyes:     Extraocular Movements: Extraocular movements intact.     Comments: Right eye is watering.  Mild conjunctival injection  Pulmonary:     Effort: Pulmonary effort is normal. No respiratory distress.  Musculoskeletal:        General: Normal range of motion.     Cervical back: Neck supple.  Skin:    General: Skin is warm and dry.  Neurological:     Mental Status: He is alert and oriented to person, place, and time.     Cranial Nerves: No cranial nerve deficit or facial asymmetry.     Sensory: No sensory deficit.     Motor: No weakness.     Coordination: Coordination abnormal (minimal asymmetry in finger nose testing with left being slightly worse than right).  Psychiatric:        Mood and Affect: Mood normal.        Speech: Speech normal.     ED Results / Procedures / Treatments   Labs (all labs ordered are listed, but only abnormal results are displayed) Labs Reviewed - No data to display  EKG None  Radiology CT Head Wo Contrast  Result Date:  04/21/2020 CLINICAL DATA:  Headache. EXAM: CT HEAD WITHOUT CONTRAST TECHNIQUE: Contiguous axial images were obtained from the base of the skull through the vertex without intravenous contrast. COMPARISON:  May 17, 2017 FINDINGS: Brain: No evidence of acute infarction, hemorrhage, hydrocephalus, extra-axial collection or mass lesion/mass effect. Vascular: No hyperdense vessel or unexpected calcification. Skull: Normal. Negative for fracture or focal lesion. Sinuses/Orbits: No acute finding. Other: None. IMPRESSION: No acute intracranial pathology. Electronically Signed   By: Aram Candela M.D.   On: 04/21/2020 16:40   MR ANGIO HEAD WO CONTRAST  Result Date: 04/21/2020 CLINICAL DATA:  Headache. EXAM: MRA HEAD WITHOUT CONTRAST TECHNIQUE: Angiographic images of the Circle of  [place] were obtained using MRA technique without intravenous contrast. COMPARISON:  None. FINDINGS: The visualized distal vertebral arteries are patent to the basilar with the right being strongly dominant. Patent PICAs and SCAs are seen bilaterally. The basilar artery is widely patent. There are left larger than right posterior communicating arteries. Both PCAs are patent without evidence of a significant proximal stenosis. The internal carotid arteries are widely patent from skull base to carotid termini. ACAs and MCAs are patent without evidence of a proximal branch occlusion or significant proximal stenosis. The right A1 segment is hypoplastic, a normal variant. No aneurysm is identified. IMPRESSION: Negative head MRA. Electronically Signed   By: Sebastian Ache M.D.   On: 04/21/2020 19:57   MR Brain Wo Contrast (neuro protocol)  Result Date: 04/21/2020 CLINICAL DATA:  Headache EXAM: MRI HEAD WITHOUT CONTRAST TECHNIQUE: Multiplanar, multiecho pulse sequences of the brain and surrounding structures were obtained without intravenous contrast. COMPARISON:  None. FINDINGS: Brain: No acute infarct, acute hemorrhage or extra-axial  collection. Normal white matter signal. Normal volume of CSF spaces. No chronic microhemorrhage. Normal midline structures. Vascular: Major flow voids are preserved. Skull and upper cervical spine: Normal calvarium and skull base. Visualized upper cervical spine and soft tissues are normal. Sinuses/Orbits:No paranasal sinus fluid levels or advanced mucosal thickening. No mastoid or middle ear effusion. Normal orbits. IMPRESSION: Normal brain MRI. Electronically Signed   By: Deatra Robinson M.D.   On: 04/21/2020 19:23    Procedures Procedures   Medications Ordered in ED Medications  ketorolac (TORADOL) 30 MG/ML injection 60 mg (has no administration in time range)    ED Course  I have reviewed the triage vital signs and the nursing notes.  Pertinent labs & imaging results that were available during my care of the patient were reviewed by me and considered in my medical decision making (see chart for details).    MDM Rules/Calculators/A&P                          The patient is 25 years old with a history of a headache disorder that might be cluster headache versus migraines.  He presents with a headache in the typical distribution, but he complains of 1 week of difficulty with his left arm.  He complains of both weakness and some loss of coordination.  Only the loss of coordination was even slightly visible on physical exam, and this finding was extremely mild.  However, because he did endorse new neurologic symptoms, he was fully evaluated.  CT and MRI were both within normal limits, and the patient was discharged home with recommendations to see his neurologist. Final Clinical Impression(s) / ED Diagnoses Final diagnoses:  Headache disorder    Rx / DC Orders ED Discharge Orders    None       Koleen Distance, MD 04/21/20 2030

## 2020-04-21 NOTE — ED Triage Notes (Signed)
Patient reports headache for thirty minutes. Hx migraines. States took verapamil 80mg  without relief.

## 2020-04-21 NOTE — ED Notes (Signed)
Patient is in MRI.  

## 2020-04-21 NOTE — ED Notes (Signed)
Patient states the right side of head is causing right eye to tear up

## 2020-04-21 NOTE — ED Notes (Signed)
Patient ambulated to CT scan.

## 2020-04-22 NOTE — Telephone Encounter (Signed)
We just started the verapamil.  It is not a quick fix.  It may take a couple of weeks at which point he is to contact us and we can increase dose.

## 2020-04-22 NOTE — Telephone Encounter (Signed)
Spoke with pt mother, We just started the verapamil.  It is not a quick fix.  It may take a couple of weeks at which point he is to contact us and we can increase dose. She stated that she would contact pt and let him know, she is in [place] at this time,

## 2020-04-24 ENCOUNTER — Telehealth: Payer: Self-pay | Admitting: Neurology

## 2020-04-24 NOTE — Telephone Encounter (Signed)
The rx is verapamil, this was already noted on 04/19/2020.it takes a while for it to work, any other suggestions for patient.

## 2020-04-25 ENCOUNTER — Other Ambulatory Visit: Payer: Self-pay

## 2020-04-25 ENCOUNTER — Other Ambulatory Visit: Payer: Self-pay | Admitting: Neurology

## 2020-04-25 MED ORDER — SUMATRIPTAN SUCCINATE 100 MG PO TABS: 100.0000 mg | ORAL_TABLET | Freq: Once | ORAL | 0 refills | Status: DC | PRN

## 2020-04-25 MED ORDER — TOPIRAMATE 25 MG PO TABS: ORAL_TABLET | ORAL | 0 refills | Status: DC

## 2020-04-25 NOTE — Telephone Encounter (Signed)
Please send in prescription for topiramate 50mg  tablet:  1 tablet at bedtime for one week, then 1 tablet twice daily.  If he cannot afford the sumatriptan injection, please send prescription for sumatriptan 100mg  tablet - take 1 tablet earliest onset of headache.  May repeat in 2 hours. Maximum 2 tablets in 24 hours.

## 2020-04-25 NOTE — Telephone Encounter (Signed)
Does not have Insurance to cover meds, he did not try the injection. Too expensive 500.00, says he will get the toprimate depending upon cost. FYI. Please send in Rx for toprimate.

## 2020-04-25 NOTE — Telephone Encounter (Signed)
Sent Rx in to pharmacy.

## 2020-04-25 NOTE — Telephone Encounter (Signed)
Has he used the sumatriptan injection as directed?  We can add a second preventative - topiramate - 50mg  at bedtime for one week, then 50mg  twice daily.  However, I cannot promise immediate relieve - that was what the prednisone was for and unfortunately it was ineffective.  It may take time.  The verapamil, which I still want him to take, can't be increased any sooner than 2 weeks after initiation.  The topiramate will not be able to to be increased for at least 4 weeks on the target dose (50mg  twice daily) Does he have health insurance that covers medications?  If not, then treatment options are unfortunately limited

## 2020-04-25 NOTE — Telephone Encounter (Signed)
Please send in prescription for topiramate 50mg  tablet:  1 tablet at bedtime for one week, then 1 tablet twice daily.

## 2020-08-03 ENCOUNTER — Other Ambulatory Visit: Payer: Self-pay

## 2020-08-03 ENCOUNTER — Encounter (HOSPITAL_COMMUNITY): Payer: Self-pay

## 2020-08-03 ENCOUNTER — Ambulatory Visit (HOSPITAL_COMMUNITY)
Admission: EM | Admit: 2020-08-03 | Discharge: 2020-08-03 | Disposition: A | Discharge status: Home / Self Care | Payer: Self-pay | Attending: Internal Medicine | Admitting: Internal Medicine

## 2020-08-03 DIAGNOSIS — Z20822 Contact with and (suspected) exposure to covid-19: Secondary | ICD-10-CM | POA: Insufficient documentation

## 2020-08-03 NOTE — ED Triage Notes (Signed)
Pt requesting covid testing for his job, denies any symptoms or any exposure to covid.

## 2020-08-04 LAB — SARS CORONAVIRUS 2 (TAT 6-24 HRS): SARS Coronavirus 2: NEGATIVE

## 2020-10-04 NOTE — Progress Notes (Deleted)
NEUROLOGY FOLLOW UP OFFICE NOTE  Aaron Morris [id number]  Assessment/Plan:   ***  Headache prevention:  *** Headache rescue:  *** Limit use of pain relievers to no more than 2 days out of week to prevent risk of rebound or medication-overuse headache. Keep headache diary Follow up ***   Subjective:  Aaron Morris is a 25 year old right-handed male who follows up for cluster headaches.   UPDATE: Cluster started in early February.  Prescribed a prednisone taper on 2/24.  Ineffective, so started verapamil and subsequently added topiramate.  Sumatriptan injection too expensive.  Prescribed 100mg  tablet.   Intensity:  severe Duration:  3 hours Frequency:  3 times a day Frequency of abortive medication: none Current NSAIDS:  no Current analgesics:  no Current triptans:  sumatriptan 100mg  Current ergotamine:  no Current anti-emetic:  no Current muscle relaxants:  no Current anti-anxiolytic:  no Current sleep aide:  no Current Antihypertensive medications:  verapamil 80mg  TID Current Antidepressant medications:  no Current Anticonvulsant medications:  topiramate 50mg  BID Current anti-CGRP:  no Current Vitamins/Herbal/Supplements:  no Current Antihistamines/Decongestants:  no Other therapy:  no Other medication:  no   Caffeine:  No coffee, tea, soda Alcohol:  no Smoker:  yes Diet:  Hydrates.   Exercise:  yes Depression/Anxiety:  History of suicidal ideation. Other pain:  no Sleep hygiene:  good   HISTORY:  Onset:  2015 Location:  Right retro-orbital/periorbital/temporal Quality:  burning Initial intensity:  Severe.  He denies new headache, thunderclap headache or severe headache that wakes him from sleep. Aura:  no Prodrome:  no Postdrome:  no Associated symptoms:  Some blurred vision in right eye.  Lacrimation in right eye.  Some conjunctival injection in right eye.  He denies associated nausea, vomiting, photophobia, phonophobia, osmophobia, unilateral numbness or  weakness. Initial Duration:  3 to 4 hours (but usually falls asleep).  Usually occurs at 4-5 pm. Initial Frequency:  Occurs once a year (Usually March to April) for about 2 months. Triggers:  Skipped meals Exacerbating factors:  no Relieving factors:  Takes pain relievers and goes to sleep Activity:  Aggravates.    He went to the ED on 05/17/17.  CT Head without contrast was personally reviewed and unremarkable.  He followed up with another neurologist who prescribed him indomethacin and Maxalt MLT.  He never tried the Maxalt.   Past NSAIDS:  Indomethacin trial (helped), Advil, Aleve, hydrocodone Past analgesics:  Tylenol Past abortive triptans:  rizatriptan, sumatriptan 6mg  [place] (expensive) Past abortive ergotamine:  no Past muscle relaxants:  no Past anti-emetic:  no Past antihypertensive medications:  no Past antidepressant medications:  no Past anticonvulsant medications:  no Past anti-CGRP:  no Past vitamins/Herbal/Supplements:  no Past antihistamines/decongestants:  no Other past therapies:  prednisone taper     Family history of headache:  no  PAST MEDICAL HISTORY: Past Medical History:  Diagnosis Date   Headache    Suicidal behavior     MEDICATIONS: Current Outpatient Medications on File Prior to Visit  Medication Sig Dispense Refill   predniSONE (DELTASONE) 10 MG tablet Take 60mg  daily x 3 days, then 50mg  daily x 3 days, then 40mg  daily x 3 days, then 30mg  daily x 3 days, then 20mg  daily x 3 days, then 10mg  daily x 3 days, then STOP (Patient taking differently: Take 10 mg by mouth See admin instructions. Take 60mg  daily x 3 days, then 50mg  daily x 3 days, then 40mg  daily x 3 days, then 30mg  daily x 3  days, then 20mg  daily x 3 days, then 10mg  daily x 3 days, then STOP) 63 tablet 0   SUMAtriptan (IMITREX) 100 MG tablet Take 1 tablet (100 mg total) by mouth once as needed for up to 1 dose for migraine. May repeat in 2 hours if headache persists or recurs. 10 tablet 0    SUMAtriptan Succinate 6 MG/0.5ML SOCT Inject 6 mg into the skin as needed (May repeat in 1 hour.  Maximum 2 injections in 24 hours). (Patient taking differently: Inject 6 mg into the skin daily as needed (May repeat in 1 hour.  Maximum 2 injections in 24 hours).) 15 mL 5   topiramate (TOPAMAX) 25 MG tablet toprimate 50mg   Tab po qd x 1 week. Then 1 tab po bid 51 tablet 0   verapamil (CALAN) 80 MG tablet Take 1 tablet (80 mg total) by mouth 3 (three) times daily. 90 tablet 1   [DISCONTINUED] cetirizine (ZYRTEC ALLERGY) 10 MG tablet Take 1 tablet (10 mg total) by mouth daily. (Patient not taking: No sig reported) 30 tablet 0   [DISCONTINUED] rizatriptan (MAXALT-MLT) 10 MG disintegrating tablet Take 1 tablet (10 mg total) by mouth as needed for migraine. May repeat in 2 hours if needed 9 tablet 11   No current facility-administered medications on file prior to visit.    ALLERGIES: No Known Allergies  FAMILY HISTORY: No family history on file.    Objective:  *** General: No acute distress.  Patient appears well-groomed.   Head:  Normocephalic/atraumatic Eyes:  Fundi examined but not visualized Neck: supple, no paraspinal tenderness, full range of motion Heart:  Regular rate and Aaron Lungs:  Clear to auscultation bilaterally Back: No paraspinal tenderness Neurological Exam: alert and oriented to person, place, and time.  Speech fluent and not dysarthric, language intact.  CN II-XII intact. Bulk and tone normal, muscle strength 5/5 throughout.  Sensation to light touch intact.  Deep tendon reflexes 2+ throughout, toes downgoing.  Finger to nose testing intact.  Gait normal, Romberg negative.   01-02-1990, DO

## 2020-10-07 ENCOUNTER — Ambulatory Visit: Payer: Self-pay | Admitting: Neurology

## 2021-11-10 ENCOUNTER — Ambulatory Visit
Admission: EM | Admit: 2021-11-10 | Discharge: 2021-11-10 | Disposition: A | Discharge status: Home / Self Care | Payer: Self-pay | Attending: Family Medicine | Admitting: Family Medicine

## 2021-11-10 DIAGNOSIS — J069 Acute upper respiratory infection, unspecified: Secondary | ICD-10-CM | POA: Insufficient documentation

## 2021-11-10 DIAGNOSIS — Z1152 Encounter for screening for COVID-19: Secondary | ICD-10-CM | POA: Insufficient documentation

## 2021-11-10 DIAGNOSIS — R509 Fever, unspecified: Secondary | ICD-10-CM | POA: Insufficient documentation

## 2021-11-10 NOTE — ED Triage Notes (Signed)
Pt presents with c/I headache and sore throat  for past few days , unsure if fever

## 2021-11-10 NOTE — ED Provider Notes (Signed)
[facility]    CSN: [id number] Arrival date & time: 11/10/21  0950      History   Chief Complaint Chief Complaint  Patient presents with   Headache   Sore Throat    HPI Aaron Morris is a 26 y.o. male.   Patient presenting today with 3-day history of fever, chills, body aches, sore throat, congestion, cough, fatigue.  Denies chest pain, shortness of breath, nausea vomiting or diarrhea.  So far taking Mucinex with minimal relief.  Multiple sick contacts at home with similar symptoms.  Requesting COVID testing before going back to work.    Past Medical History:  Diagnosis Date   Headache    Suicidal behavior     There are no problems to display for this patient.   History reviewed. No pertinent surgical history.     Home Medications    Prior to Admission medications   Medication Sig Start Date End Date Taking? Authorizing Provider  predniSONE (DELTASONE) 10 MG tablet Take 60mg  daily x 3 days, then 50mg  daily x 3 days, then 40mg  daily x 3 days, then 30mg  daily x 3 days, then 20mg  daily x 3 days, then 10mg  daily x 3 days, then STOP Patient taking differently: Take 10 mg by mouth See admin instructions. Take 60mg  daily x 3 days, then 50mg  daily x 3 days, then 40mg  daily x 3 days, then 30mg  daily x 3 days, then 20mg  daily x 3 days, then 10mg  daily x 3 days, then STOP 04/04/20   Jaffe, Adam R, DO  SUMAtriptan (IMITREX) 100 MG tablet Take 1 tablet (100 mg total) by mouth once as needed for up to 1 dose for migraine. May repeat in 2 hours if headache persists or recurs. 04/25/20   Pieter Partridge, DO  SUMAtriptan Succinate 6 MG/0.5ML SOCT Inject 6 mg into the skin as needed (May repeat in 1 hour.  Maximum 2 injections in 24 hours). Patient taking differently: Inject 6 mg into the skin daily as needed (May repeat in 1 hour.  Maximum 2 injections in 24 hours). 04/04/20   Pieter Partridge, DO  topiramate (TOPAMAX) 25 MG tablet toprimate 50mg   Tab po qd x 1 week. Then 1 tab po bid  04/25/20   Pieter Partridge, DO  verapamil (CALAN) 80 MG tablet Take 1 tablet (80 mg total) by mouth 3 (three) times daily. 04/19/20   Pieter Partridge, DO  cetirizine (ZYRTEC ALLERGY) 10 MG tablet Take 1 tablet (10 mg total) by mouth daily. Patient not taking: No sig reported 03/11/20 04/21/20  Jaynee Eagles, PA-C  rizatriptan (MAXALT-MLT) 10 MG disintegrating tablet Take 1 tablet (10 mg total) by mouth as needed for migraine. May repeat in 2 hours if needed 05/24/17 06/14/19  Penumalli, Earlean Polka, MD    Family History History reviewed. No pertinent family history.  Social History Social History   Tobacco Use   Smoking status: Former    Types: Cigarettes   Smokeless tobacco: Never  Substance Use Topics   Alcohol use: Not Currently   Drug use: Not Currently    Types: Marijuana     Allergies   Patient has no known allergies.   Review of Systems Review of Systems PER HPI  Physical Exam Triage Vital Signs ED Triage Vitals  Enc Vitals Group     BP 11/10/21 1017 111/71     Pulse Rate 11/10/21 1017 67     Resp 11/10/21 1017 20     Temp 11/10/21  1017 98.8 F (37.1 C)     Temp src --      SpO2 11/10/21 1017 96 %     Weight --      Height --      Head Circumference --      Peak Flow --      Pain Score 11/10/21 1015 6     Pain Loc --      Pain Edu? --      Excl. in GC? --    No data found.  Updated Vital Signs BP 111/71   Pulse 67   Temp 98.8 F (37.1 C)   Resp 20   SpO2 96%   Visual Acuity Right Eye Distance:   Left Eye Distance:   Bilateral Distance:    Right Eye Near:   Left Eye Near:    Bilateral Near:     Physical Exam Vitals and nursing note reviewed.  Constitutional:      Appearance: He is well-developed.  HENT:     Head: Atraumatic.     Right Ear: External ear normal.     Left Ear: External ear normal.     Nose: Rhinorrhea present.     Mouth/Throat:     Pharynx: Posterior oropharyngeal erythema present. No oropharyngeal exudate.  Eyes:      Conjunctiva/sclera: Conjunctivae normal.     Pupils: Pupils are equal, round, and reactive to light.  Cardiovascular:     Rate and Rhythm: Normal rate and regular rhythm.  Pulmonary:     Effort: Pulmonary effort is normal. No respiratory distress.     Breath sounds: No wheezing or rales.  Musculoskeletal:        General: Normal range of motion.     Cervical back: Normal range of motion and neck supple.  Lymphadenopathy:     Cervical: No cervical adenopathy.  Skin:    General: Skin is warm and dry.  Neurological:     Mental Status: He is alert and oriented to person, place, and time.  Psychiatric:        Behavior: Behavior normal.    UC Treatments / Results  Labs (all labs ordered are listed, but only abnormal results are displayed) Labs Reviewed  SARS CORONAVIRUS 2 (TAT 6-24 HRS)    EKG   Radiology No results found.  Procedures Procedures (including critical care time)  Medications Ordered in UC Medications - No data to display  Initial Impression / Assessment and Plan / UC Course  I have reviewed the triage vital signs and the nursing notes.  Pertinent labs & imaging results that were available during my care of the patient were reviewed by me and considered in my medical decision making (see chart for details).     Respiratory panel pending, vitals and exam reassuring and suggestive of viral upper respiratory infection.  He declines any prescription medications and wishes to take over-the-counter cold and congestion medications.  Work note given.  Return for worsening symptoms.  Final Clinical Impressions(s) / UC Diagnoses   Final diagnoses:  Viral URI with cough  Fever, unspecified   Discharge Instructions   None    ED Prescriptions   None    PDMP not reviewed this encounter.   Particia Nearing, [place] 11/10/21 1043

## 2021-11-11 LAB — SARS CORONAVIRUS 2 (TAT 6-24 HRS): SARS Coronavirus 2: NEGATIVE

## 2022-10-21 ENCOUNTER — Ambulatory Visit
Admission: EM | Admit: 2022-10-21 | Discharge: 2022-10-21 | Disposition: A | Discharge status: Home / Self Care | Payer: Self-pay | Attending: Family Medicine | Admitting: Family Medicine

## 2022-10-21 ENCOUNTER — Encounter: Payer: Self-pay | Admitting: Emergency Medicine

## 2022-10-21 DIAGNOSIS — J069 Acute upper respiratory infection, unspecified: Secondary | ICD-10-CM

## 2022-10-21 DIAGNOSIS — S39012A Strain of muscle, fascia and tendon of lower back, initial encounter: Secondary | ICD-10-CM

## 2022-10-21 LAB — POCT RAPID STREP A (OFFICE): Rapid Strep A Screen: NEGATIVE

## 2022-10-21 NOTE — Discharge Instructions (Signed)
You may take ibuprofen and Tylenol for your back pain, use heating pads, massage, rest.  As far as your sick symptoms, I suspect you have COVID.  You may take DayQuil, NyQuil, use Flonase or any other over-the-counter remedies to help with your symptoms.

## 2022-10-21 NOTE — ED Provider Notes (Signed)
[facility]    CSN: [id number] Arrival date & time: 10/21/22  1704      History   Chief Complaint No chief complaint on file.   HPI Aaron Morris is a 27 y.o. male.   Presenting today with 2-day history of nasal congestion, cough, sore throat, fatigue, body aches.  Denies chest pain, shortness of breath, abdominal pain, nausea vomiting or diarrhea.  So far not trying anything over-the-counter for symptoms.  Household contacts both sick with similar symptoms.  Also with some right low back pain after lifting something heavy at work the other day.  Denies leg weakness numbness or tingling, bowel or bladder incontinence, saddle anesthesias.  Not tried anything for symptoms.    Past Medical History:  Diagnosis Date   Headache    Suicidal behavior     There are no problems to display for this patient.   History reviewed. No pertinent surgical history.     Home Medications    Prior to Admission medications   Medication Sig Start Date End Date Taking? Authorizing Provider  cetirizine (ZYRTEC ALLERGY) 10 MG tablet Take 1 tablet (10 mg total) by mouth daily. Patient not taking: No sig reported 03/11/20 04/21/20  Wallis Bamberg, PA-C  rizatriptan (MAXALT-MLT) 10 MG disintegrating tablet Take 1 tablet (10 mg total) by mouth as needed for migraine. May repeat in 2 hours if needed 05/24/17 06/14/19  Penumalli, Glenford Bayley, MD    Family History History reviewed. No pertinent family history.  Social History Social History   Tobacco Use   Smoking status: Former    Types: Cigarettes   Smokeless tobacco: Never  Substance Use Topics   Alcohol use: Not Currently   Drug use: Not Currently    Types: Marijuana     Allergies   Patient has no known allergies.   Review of Systems Review of Systems PER HPI  Physical Exam Triage Vital Signs ED Triage Vitals  Encounter Vitals Group     BP 10/21/22 1708 117/78     Systolic BP Percentile --      Diastolic BP Percentile --       Pulse Rate 10/21/22 1708 65     Resp 10/21/22 1708 18     Temp 10/21/22 1708 99.5 F (37.5 C)     Temp Source 10/21/22 1708 Oral     SpO2 10/21/22 1708 94 %     Weight --      Height --      Head Circumference --      Peak Flow --      Pain Score 10/21/22 1710 4     Pain Loc --      Pain Education --      Exclude from Growth Chart --    No data found.  Updated Vital Signs BP 117/78 (BP Location: Right Arm)   Pulse 65   Temp 99.5 F (37.5 C) (Oral)   Resp 18   SpO2 94%   Visual Acuity Right Eye Distance:   Left Eye Distance:   Bilateral Distance:    Right Eye Near:   Left Eye Near:    Bilateral Near:     Physical Exam Vitals and nursing note reviewed.  Constitutional:      Appearance: He is well-developed.  HENT:     Head: Atraumatic.     Right Ear: External ear normal.     Left Ear: External ear normal.     Nose: Rhinorrhea present.  Mouth/Throat:     Pharynx: Posterior oropharyngeal erythema present. No oropharyngeal exudate.  Eyes:     Conjunctiva/sclera: Conjunctivae normal.     Pupils: Pupils are equal, round, and reactive to light.  Cardiovascular:     Rate and Rhythm: Normal rate and regular rhythm.     Heart sounds: Normal heart sounds.  Pulmonary:     Effort: Pulmonary effort is normal. No respiratory distress.     Breath sounds: No wheezing or rales.  Musculoskeletal:        General: Tenderness present. Normal range of motion.     Cervical back: Normal range of motion and neck supple.     Comments: Ttp right lumbar musculature Negative straight leg raise bilateral lower extremities.  No midline spinal tenderness to palpation diffusely  Lymphadenopathy:     Cervical: No cervical adenopathy.  Skin:    General: Skin is warm and dry.     Findings: No bruising or erythema.  Neurological:     Mental Status: He is alert and oriented to person, place, and time.     Motor: No weakness.     Gait: Gait normal.  Psychiatric:        Behavior:  Behavior normal.     UC Treatments / Results  Labs (all labs ordered are listed, but only abnormal results are displayed) Labs Reviewed  POCT RAPID STREP A (OFFICE)    EKG   Radiology No results found.  Procedures Procedures (including critical care time)  Medications Ordered in UC Medications - No data to display  Initial Impression / Assessment and Plan / UC Course  I have reviewed the triage vital signs and the nursing notes.  Pertinent labs & imaging results that were available during my care of the patient were reviewed by me and considered in my medical decision making (see chart for details).     Consistent with viral illness, rapid strep negative, declines viral testing today.  Discussed supportive over-the-counter indications, home care.  For back strain, declines prescription remedies, treat with over-the-counter pain relievers, heat, massage.  Work note given.  Return for worsening symptoms.  Final Clinical Impressions(s) / UC Diagnoses   Final diagnoses:  Viral URI  Strain of lumbar region, initial encounter     Discharge Instructions      You may take ibuprofen and Tylenol for your back pain, use heating pads, massage, rest.  As far as your sick symptoms, I suspect you have COVID.  You may take DayQuil, NyQuil, use Flonase or any other over-the-counter remedies to help with your symptoms.    ED Prescriptions   None    PDMP not reviewed this encounter.   Particia Nearing, [place] 10/21/22 1816

## 2022-10-21 NOTE — ED Triage Notes (Signed)
Sore throat, nasal congestion and fatigue x 2 days.

## 2022-12-05 DIAGNOSIS — S51819A Laceration without foreign body of unspecified forearm, initial encounter: Secondary | ICD-10-CM

## 2022-12-05 HISTORY — DX: Laceration without foreign body of unspecified forearm, initial encounter: S51.819A

## 2022-12-15 ENCOUNTER — Encounter (HOSPITAL_BASED_OUTPATIENT_CLINIC_OR_DEPARTMENT_OTHER): Payer: Self-pay | Admitting: Orthopedic Surgery

## 2022-12-15 ENCOUNTER — Other Ambulatory Visit: Payer: Self-pay | Admitting: Orthopedic Surgery

## 2022-12-15 ENCOUNTER — Other Ambulatory Visit: Payer: Self-pay

## 2022-12-18 ENCOUNTER — Other Ambulatory Visit: Payer: Self-pay

## 2022-12-18 ENCOUNTER — Ambulatory Visit (HOSPITAL_BASED_OUTPATIENT_CLINIC_OR_DEPARTMENT_OTHER): Payer: Self-pay | Admitting: Anesthesiology

## 2022-12-18 ENCOUNTER — Encounter (HOSPITAL_BASED_OUTPATIENT_CLINIC_OR_DEPARTMENT_OTHER): Payer: Self-pay | Admitting: Orthopedic Surgery

## 2022-12-18 ENCOUNTER — Encounter (HOSPITAL_BASED_OUTPATIENT_CLINIC_OR_DEPARTMENT_OTHER)
Admission: RE | Disposition: A | Discharge status: Home / Self Care | Payer: Self-pay | Source: Home / Self Care | Attending: Orthopedic Surgery

## 2022-12-18 ENCOUNTER — Ambulatory Visit (HOSPITAL_BASED_OUTPATIENT_CLINIC_OR_DEPARTMENT_OTHER)
Admission: RE | Admit: 2022-12-18 | Discharge: 2022-12-18 | Disposition: A | Discharge status: Home / Self Care | Payer: Self-pay | Attending: Orthopedic Surgery | Admitting: Orthopedic Surgery

## 2022-12-18 DIAGNOSIS — S56221A Laceration of other flexor muscle, fascia and tendon at forearm level, right arm, initial encounter: Secondary | ICD-10-CM

## 2022-12-18 DIAGNOSIS — W293XXA Contact with powered garden and outdoor hand tools and machinery, initial encounter: Secondary | ICD-10-CM | POA: Insufficient documentation

## 2022-12-18 DIAGNOSIS — Z87891 Personal history of nicotine dependence: Secondary | ICD-10-CM | POA: Insufficient documentation

## 2022-12-18 DIAGNOSIS — S51811A Laceration without foreign body of right forearm, initial encounter: Secondary | ICD-10-CM | POA: Insufficient documentation

## 2022-12-18 HISTORY — PX: REPAIR EXTENSOR TENDON: SHX5382

## 2022-12-18 SURGERY — REPAIR, TENDON, EXTENSOR
Anesthesia: General | Site: Arm Lower | Laterality: Right

## 2022-12-18 MED ORDER — LIDOCAINE HCL (PF) 1 % IJ SOLN
INTRAMUSCULAR | Status: AC
  Filled 2022-12-18: qty 30

## 2022-12-18 MED ORDER — ACETAMINOPHEN 10 MG/ML IV SOLN
1000.0000 mg | Freq: Once | INTRAVENOUS | Status: DC | PRN
  Administered 2022-12-18: 1000 mg via INTRAVENOUS

## 2022-12-18 MED ORDER — HYDROCODONE-ACETAMINOPHEN 5-325 MG PO TABS: ORAL_TABLET | ORAL | 0 refills | Status: AC

## 2022-12-18 MED ORDER — OXYCODONE HCL 5 MG PO TABS
ORAL_TABLET | ORAL | Status: AC
  Filled 2022-12-18: qty 1

## 2022-12-18 MED ORDER — PROPOFOL 500 MG/50ML IV EMUL
INTRAVENOUS | Status: AC
  Filled 2022-12-18: qty 100

## 2022-12-18 MED ORDER — ONDANSETRON HCL 4 MG/2ML IJ SOLN
INTRAMUSCULAR | Status: AC
  Filled 2022-12-18: qty 2

## 2022-12-18 MED ORDER — SULFAMETHOXAZOLE-TRIMETHOPRIM 800-160 MG PO TABS: 1.0000 | ORAL_TABLET | Freq: Two times a day (BID) | ORAL | 0 refills | Status: AC

## 2022-12-18 MED ORDER — BUPIVACAINE HCL (PF) 0.25 % IJ SOLN
INTRAMUSCULAR | Status: DC | PRN
  Administered 2022-12-18: 9 mL

## 2022-12-18 MED ORDER — CYCLOBENZAPRINE HCL 10 MG PO TABS: 10.0000 mg | ORAL_TABLET | Freq: Three times a day (TID) | ORAL | 0 refills | Status: AC | PRN

## 2022-12-18 MED ORDER — LIDOCAINE 2% (20 MG/ML) 5 ML SYRINGE
INTRAMUSCULAR | Status: AC
  Filled 2022-12-18: qty 20

## 2022-12-18 MED ORDER — PHENYLEPHRINE 80 MCG/ML (10ML) SYRINGE FOR IV PUSH (FOR BLOOD PRESSURE SUPPORT)
PREFILLED_SYRINGE | INTRAVENOUS | Status: AC
  Filled 2022-12-18: qty 30

## 2022-12-18 MED ORDER — ACETAMINOPHEN 10 MG/ML IV SOLN
INTRAVENOUS | Status: AC
  Filled 2022-12-18: qty 100

## 2022-12-18 MED ORDER — OXYCODONE HCL 5 MG/5ML PO SOLN: 5.0000 mg | Freq: Once | ORAL | Status: AC | PRN

## 2022-12-18 MED ORDER — CEFAZOLIN SODIUM-DEXTROSE 2-4 GM/100ML-% IV SOLN
2.0000 g | INTRAVENOUS | Status: AC
  Administered 2022-12-18: 2 g via INTRAVENOUS

## 2022-12-18 MED ORDER — LACTATED RINGERS IV SOLN: INTRAVENOUS | Status: DC

## 2022-12-18 MED ORDER — FENTANYL CITRATE (PF) 250 MCG/5ML IJ SOLN
INTRAMUSCULAR | Status: DC | PRN
  Administered 2022-12-18 (×2): 50 ug via INTRAVENOUS

## 2022-12-18 MED ORDER — FENTANYL CITRATE (PF) 100 MCG/2ML IJ SOLN
INTRAMUSCULAR | Status: AC
  Filled 2022-12-18: qty 2

## 2022-12-18 MED ORDER — OXYCODONE HCL 5 MG PO TABS
5.0000 mg | ORAL_TABLET | Freq: Once | ORAL | Status: AC | PRN
  Administered 2022-12-18: 5 mg via ORAL

## 2022-12-18 MED ORDER — HEPARIN SODIUM (PORCINE) 1000 UNIT/ML IJ SOLN
INTRAMUSCULAR | Status: AC
Start: 2022-12-18 — End: ?
  Filled 2022-12-18: qty 1

## 2022-12-18 MED ORDER — DEXAMETHASONE SODIUM PHOSPHATE 10 MG/ML IJ SOLN
INTRAMUSCULAR | Status: AC
  Filled 2022-12-18: qty 1

## 2022-12-18 MED ORDER — AMISULPRIDE (ANTIEMETIC) 5 MG/2ML IV SOLN: 10.0000 mg | Freq: Once | INTRAVENOUS | Status: DC | PRN

## 2022-12-18 MED ORDER — FENTANYL CITRATE (PF) 100 MCG/2ML IJ SOLN
25.0000 ug | INTRAMUSCULAR | Status: DC | PRN
  Administered 2022-12-18 (×2): 50 ug via INTRAVENOUS

## 2022-12-18 MED ORDER — KETOROLAC TROMETHAMINE 30 MG/ML IJ SOLN
30.0000 mg | Freq: Once | INTRAMUSCULAR | Status: AC | PRN
  Administered 2022-12-18: 30 mg via INTRAVENOUS

## 2022-12-18 MED ORDER — ONDANSETRON HCL 4 MG/2ML IJ SOLN
INTRAMUSCULAR | Status: DC | PRN
  Administered 2022-12-18: 4 mg via INTRAVENOUS

## 2022-12-18 MED ORDER — EPHEDRINE 5 MG/ML INJ
INTRAVENOUS | Status: AC
  Filled 2022-12-18: qty 10

## 2022-12-18 MED ORDER — 0.9 % SODIUM CHLORIDE (POUR BTL) OPTIME
TOPICAL | Status: DC | PRN
  Administered 2022-12-18: 300 mL

## 2022-12-18 MED ORDER — DEXAMETHASONE SODIUM PHOSPHATE 10 MG/ML IJ SOLN
INTRAMUSCULAR | Status: AC
  Filled 2022-12-18: qty 3

## 2022-12-18 MED ORDER — CEFAZOLIN SODIUM-DEXTROSE 2-4 GM/100ML-% IV SOLN
INTRAVENOUS | Status: AC
  Filled 2022-12-18: qty 100

## 2022-12-18 MED ORDER — PROPOFOL 10 MG/ML IV BOLUS
INTRAVENOUS | Status: DC | PRN
  Administered 2022-12-18: 200 mg via INTRAVENOUS

## 2022-12-18 MED ORDER — DEXAMETHASONE SODIUM PHOSPHATE 10 MG/ML IJ SOLN
INTRAMUSCULAR | Status: DC | PRN
  Administered 2022-12-18: 10 mg via INTRAVENOUS

## 2022-12-18 MED ORDER — ONDANSETRON HCL 4 MG/2ML IJ SOLN
INTRAMUSCULAR | Status: AC
Start: 2022-12-18 — End: ?
  Filled 2022-12-18: qty 6

## 2022-12-18 MED ORDER — LIDOCAINE 2% (20 MG/ML) 5 ML SYRINGE
INTRAMUSCULAR | Status: DC | PRN
  Administered 2022-12-18: 60 mg via INTRAVENOUS

## 2022-12-18 MED ORDER — KETOROLAC TROMETHAMINE 30 MG/ML IJ SOLN
INTRAMUSCULAR | Status: AC
  Filled 2022-12-18: qty 1

## 2022-12-18 MED ORDER — MIDAZOLAM HCL 2 MG/2ML IJ SOLN
INTRAMUSCULAR | Status: DC | PRN
  Administered 2022-12-18: 2 mg via INTRAVENOUS

## 2022-12-18 SURGICAL SUPPLY — 61 items
APL PRP STRL LF DISP 70% ISPRP (MISCELLANEOUS) ×1
BAG DECANTER FOR FLEXI CONT (MISCELLANEOUS) IMPLANT
BLADE MINI RND TIP GREEN BEAV (BLADE) IMPLANT
BLADE SURG 15 STRL LF DISP TIS (BLADE) ×2 IMPLANT
BLADE SURG 15 STRL SS (BLADE) ×2
BNDG CMPR 5X3 KNIT ELC UNQ LF (GAUZE/BANDAGES/DRESSINGS) ×1
BNDG CMPR 9X4 STRL LF SNTH (GAUZE/BANDAGES/DRESSINGS)
BNDG ELASTIC 3INX 5YD STR LF (GAUZE/BANDAGES/DRESSINGS) ×1 IMPLANT
BNDG ESMARK 4X9 LF (GAUZE/BANDAGES/DRESSINGS) IMPLANT
BNDG GAUZE DERMACEA FLUFF 4 (GAUZE/BANDAGES/DRESSINGS) ×1 IMPLANT
BNDG GZE DERMACEA 4 6PLY (GAUZE/BANDAGES/DRESSINGS) ×1
BRUSH SCRUB EZ PLAIN DRY (MISCELLANEOUS) ×1 IMPLANT
CATH ROBINSON RED A/P 10FR (CATHETERS) IMPLANT
CHLORAPREP W/TINT 26 (MISCELLANEOUS) ×1 IMPLANT
CORD BIPOLAR FORCEPS 12FT (ELECTRODE) ×1 IMPLANT
COVER BACK TABLE 60X90IN (DRAPES) ×1 IMPLANT
COVER MAYO STAND STRL (DRAPES) ×1 IMPLANT
CUFF TOURN SGL QUICK 18X4 (TOURNIQUET CUFF) IMPLANT
DRAPE EXTREMITY T 121X128X90 (DISPOSABLE) ×1 IMPLANT
DRAPE SURG 17X23 STRL (DRAPES) ×1 IMPLANT
GAUZE SPONGE 4X4 12PLY STRL (GAUZE/BANDAGES/DRESSINGS) ×1 IMPLANT
GAUZE XEROFORM 1X8 LF (GAUZE/BANDAGES/DRESSINGS) ×1 IMPLANT
GLOVE BIO SURGEON STRL SZ7.5 (GLOVE) ×1 IMPLANT
GLOVE BIOGEL PI IND STRL 8 (GLOVE) ×1 IMPLANT
GLOVE BIOGEL PI IND STRL 8.5 (GLOVE) IMPLANT
GLOVE SURG ORTHO 8.0 STRL STRW (GLOVE) IMPLANT
GOWN STRL REUS W/ TWL LRG LVL3 (GOWN DISPOSABLE) ×1 IMPLANT
GOWN STRL REUS W/TWL LRG LVL3 (GOWN DISPOSABLE) ×1
GOWN STRL REUS W/TWL XL LVL3 (GOWN DISPOSABLE) ×1 IMPLANT
LOOP VASCLR MAXI BLUE 18IN ST (MISCELLANEOUS) IMPLANT
LOOPS VASCLR MAXI BLUE 18IN ST (MISCELLANEOUS) IMPLANT
NDL HYPO 25X1 1.5 SAFETY (NEEDLE) IMPLANT
NDL SAFETY ECLIPSE 18X1.5 (NEEDLE) IMPLANT
NEEDLE HYPO 25X1 1.5 SAFETY (NEEDLE) IMPLANT
NS IRRIG 1000ML POUR BTL (IV SOLUTION) ×1 IMPLANT
PACK BASIN DAY SURGERY FS (CUSTOM PROCEDURE TRAY) ×1 IMPLANT
PAD CAST 3X4 CTTN HI CHSV (CAST SUPPLIES) ×1 IMPLANT
PAD CAST 4YDX4 CTTN HI CHSV (CAST SUPPLIES) IMPLANT
PADDING CAST ABS COTTON 4X4 ST (CAST SUPPLIES) ×1 IMPLANT
PADDING CAST COTTON 3X4 STRL (CAST SUPPLIES) ×1
PADDING CAST COTTON 4X4 STRL (CAST SUPPLIES) ×1
SLEEVE SCD COMPRESS KNEE MED (STOCKING) IMPLANT
SPEAR EYE SURG WECK-CEL (MISCELLANEOUS) ×1 IMPLANT
SPIKE FLUID TRANSFER (MISCELLANEOUS) ×1 IMPLANT
SPLINT PLASTER CAST XFAST 3X15 (CAST SUPPLIES) IMPLANT
STOCKINETTE 4X48 STRL (DRAPES) ×1 IMPLANT
SUT ETHIBOND 3-0 V-5 (SUTURE) IMPLANT
SUT ETHILON 4 0 PS 2 18 (SUTURE) ×1 IMPLANT
SUT FIBERWIRE 4-0 18 TAPR NDL (SUTURE) ×2
SUT NYLON 9 0 VRM6 (SUTURE) IMPLANT
SUT PROLENE 6 0 P 1 18 (SUTURE) IMPLANT
SUT SILK 4 0 PS 2 (SUTURE) IMPLANT
SUT SUPRAMID 4-0 (SUTURE) IMPLANT
SUT VIC AB 4-0 PS2 18 (SUTURE) IMPLANT
SUTURE FIBERWR 4-0 18 TAPR NDL (SUTURE) IMPLANT
SYR BULB EAR ULCER 3OZ GRN STR (SYRINGE) ×1 IMPLANT
SYR CONTROL 10ML LL (SYRINGE) IMPLANT
TOWEL GREEN STERILE FF (TOWEL DISPOSABLE) ×2 IMPLANT
TRAY DSU PREP LF (CUSTOM PROCEDURE TRAY) IMPLANT
UNDERPAD 30X36 HEAVY ABSORB (UNDERPADS AND DIAPERS) ×1 IMPLANT
VASCULAR TIE MAXI BLUE 18IN ST (MISCELLANEOUS)

## 2022-12-18 NOTE — Anesthesia Procedure Notes (Addendum)
Procedure Name: LMA Insertion Date/Time: 12/18/2022 4:25 PM  Performed by: Alvera Novel, CRNAPre-anesthesia Checklist: Patient identified, Emergency Drugs available, Suction available and Patient being monitored Patient Re-evaluated:Patient Re-evaluated prior to induction Oxygen Delivery Method: Circle System Utilized Preoxygenation: Pre-oxygenation with 100% oxygen Induction Type: IV induction Ventilation: Mask ventilation without difficulty LMA: LMA inserted LMA Size: 4.0 Number of attempts: 1 Placement Confirmation: positive ETCO2 Tube secured with: Tape Dental Injury: Teeth and Oropharynx as per pre-operative assessment

## 2022-12-18 NOTE — Anesthesia Preprocedure Evaluation (Addendum)
Anesthesia Evaluation  Patient identified by MRN, date of birth, ID band Patient awake    Reviewed: Allergy & Precautions, NPO status , Patient's Chart, lab work & pertinent test results  Airway Mallampati: I  TM Distance: >3 FB Neck ROM: Full    Dental no notable dental hx.    Pulmonary Patient abstained from smoking., former smoker   Pulmonary exam normal        Cardiovascular negative cardio ROS Normal cardiovascular exam     Neuro/Psych  Headaches PSYCHIATRIC DISORDERS         GI/Hepatic negative GI ROS, Neg liver ROS,,,  Endo/Other  negative endocrine ROS    Renal/GU negative Renal ROS     Musculoskeletal negative musculoskeletal ROS (+)    Abdominal   Peds  Hematology negative hematology ROS (+)   Anesthesia Other Findings EXTENSOR TENDON LACERACTION RIGHT FOREARM  Reproductive/Obstetrics                             Anesthesia Physical Anesthesia Plan  ASA: 2  Anesthesia Plan: General   Post-op Pain Management:    Induction: Intravenous  PONV Risk Score and Plan: 2 and Ondansetron, Dexamethasone, Midazolam and Treatment may vary due to age or medical condition  Airway Management Planned: LMA  Additional Equipment:   Intra-op Plan:   Post-operative Plan: Extubation in OR  Informed Consent: I have reviewed the patients History and Physical, chart, labs and discussed the procedure including the risks, benefits and alternatives for the proposed anesthesia with the patient or authorized representative who has indicated his/her understanding and acceptance.     Dental advisory given  Plan Discussed with: CRNA  Anesthesia Plan Comments:        Anesthesia Quick Evaluation

## 2022-12-18 NOTE — Op Note (Addendum)
NAME: Kaenon Fuhrmeister MEDICAL RECORD NO: [id number] DATE OF BIRTH: 1995-07-05 FACILITY: [facility] LOCATION: [facility] PHYSICIAN: Tami Ribas, MD   OPERATIVE REPORT   DATE OF PROCEDURE: 12/18/22    PREOPERATIVE DIAGNOSIS: Right forearm laceration   POSTOPERATIVE DIAGNOSIS: Right forearm laceration with APL tendon laceration, EDC to index long and ring finger laceration   PROCEDURE: 1.  Right forearm exploration of wound 2.  Right APL tendon laceration repair and forearm 3.  Right EDC to index finger tendon repair in the forearm 4.  Right EDC to long finger tendon repair in the forearm 5.  Right EDC to ring finger tendon repair in the forearm   SURGEON:  Betha Loa, M.D.   ASSISTANT: Cindee Salt, MD   ANESTHESIA:  General   INTRAVENOUS FLUIDS:  Per anesthesia flow sheet.   ESTIMATED BLOOD LOSS:  Minimal.   COMPLICATIONS:  None.   SPECIMENS: Cultures to micro   TOURNIQUET TIME:    Total Tourniquet Time Documented: Upper Arm (Right) - 59 minutes Total: Upper Arm (Right) - 59 minutes    DISPOSITION:  Stable to PACU.   INDICATIONS: 27 year old male states approximately 2 weeks ago he sustained a laceration to his right forearm from a chainsaw.  He was seen at outside emergency department where the wound was closed in he was referred for follow-up.  He has weakness in extension of the fingers.  Wishes to proceed with operative exploration and repair of tendon artery nerve is necessary.  Risks, benefits and alternatives of surgery were discussed including the risks of blood loss, infection, damage to nerves, vessels, tendons, ligaments, bone for surgery, need for additional surgery, complications with wound healing, continued pain, stiffness.  He voiced understanding of these risks and elected to proceed.  OPERATIVE COURSE:  After being identified preoperatively by myself,  the patient and I agreed on the procedure and site of the procedure.  The surgical site was  marked.  Surgical consent had been signed. Preoperative IV antibiotic prophylaxis was given. He was transferred to the operating room and placed on the operating table in supine position with the Right upper extremity on an arm board.  General anesthesia was induced by the anesthesiologist.  Right upper extremity was prepped and draped in normal sterile orthopedic fashion.  A surgical pause was performed between the surgeons, anesthesia, and operating room staff and all were in agreement as to the patient, procedure, and site of procedure.  Tourniquet at the proximal aspect of the extremity was inflated to 250 mmHg after exsanguination of the arm with an Esmarch bandage.  The wound was opened.  Cultures were taken for aerobes and anaerobes.  The wound was extended proximally and distally to aid in visualization.  The ECRL and ECRB tendons were intact.  EPL tendon was intact.  There appeared to be an accessory EPL tendon as well.  The EPB tendon was intact.  APL tendon was lacerated.  EDC tendon to the index long and ring fingers was lacerated.  There was an extensor tendon to the small finger which was intact.  The tendon stumps were mobilized and freed up of early scar.  There was some contracture and muscle.  The APL tendon was repaired with a 4-0 FiberWire suture in a modified Kessler technique and then oversewn with a figure-of-eight suture.  The EDC tendon to the ring finger was repaired with a 4-0 FiberWire suture in a modified Kessler technique and then oversewn with a figure-of-eight suture.  The  EDC tendon to the long finger was repaired with a 4-0 FiberWire suture in a modified Kessler technique and then oversewn with a figure-of-eight suture.  The EDC tendon to the index finger was repaired with a 4-0 FiberWire suture in a modified Kessler technique and then oversewn with a figure-of-eight suture.this provided good reapproximation of all of the tendon ends.  The wound was copiously irrigated with sterile  saline.  Inverted interrupted 4-0 Vicryl sutures were placed in subcutaneous tissues and the skin was closed with staples.  The wound was injected with quarter percent plain Marcaine to aid in postoperative analgesia.  It was then dressed with sterile Xeroform 4 x 4's and wrapped with a Kerlix bandage.  Volar splint was placed with the wrist in approximately 40 degrees extension with the MPs extended and the IP's extended.  This was wrapped with Kerlix and Ace bandage.  The tourniquet was deflated at 59 minutes.  Fingertips were pink with brisk capillary refill after deflation of tourniquet.  The operative  drapes were broken down.  The patient was awoken from anesthesia safely.  He was transferred back to the stretcher and taken to PACU in stable condition.  I will see him back in the office in 1 week for postoperative followup.  I will give him a prescription for Norco 5/325 1-2 tabs PO q6 hours prn pain, dispense # 20 and Bactrim DS 1 p.o. twice daily x 7 days.   Betha Loa, MD Electronically signed, 12/18/22

## 2022-12-18 NOTE — H&P (Signed)
  Aaron Morris is an 27 y.o. male.   Chief Complaint: forearm laceration HPI: 27 yo male states he sustained laceration to right forearm from chainsaw two weeks ago.  Seen in ED where wound was cleaned and sutured.  Difficulty with digit extension.  He wishes to proceed with operative exploration and repair tendon/artery/nerve as necessary.  Allergies: No Known Allergies  Past Medical History:  Diagnosis Date   Forearm laceration 12/05/2022   Headache    Suicidal behavior     Past Surgical History:  Procedure Laterality Date   NO PAST SURGERIES      Family History: History reviewed. No pertinent family history.  Social History:   reports that he has quit smoking. His smoking use included cigarettes. He has never used smokeless tobacco. He reports that he does not currently use alcohol. He reports that he does not currently use drugs after having used the following drugs: Marijuana.  Medications: No medications prior to admission.    No results found for this or any previous visit (from the past 48 hour(s)).  No results found.    Blood pressure 106/82, pulse (!) 56, temperature 98.4 F (36.9 C), temperature source Temporal, resp. rate 18, height 6' (1.829 m), weight 68.7 kg, SpO2 100%.  General appearance: alert, cooperative, and appears stated age Head: Normocephalic, without obvious abnormality, atraumatic Neck: supple, symmetrical, trachea midline Extremities: Intact sensation and capillary refill all digits.  +epl/fpl/io.  Laceration dorsal forearm.  Weak finger extension Pulses: 2+ and symmetric Skin: Skin color, texture, turgor normal. No rashes or lesions Neurologic: Grossly normal Incision/Wound: as above  Assessment/Plan Right forearm laceration.  Plan operative exploration with repair tendon/artery/nerve as necessary.  Non operative and operative treatment options have been discussed with the patient and patient wishes to proceed with operative treatment. Risks,  benefits and alternatives of surgery were discussed including risks of blood loss, infection, damage to nerves/vessels/tendons/ligament/bone, failure of surgery, need for additional surgery, complication with wound healing, stiffness.  He voiced understanding of these risks and elected to proceed.    Betha Loa 12/18/2022, 1:46 PM

## 2022-12-18 NOTE — Transfer of Care (Signed)
Immediate Anesthesia Transfer of Care Note  Patient: Aaron Morris  Procedure(s) Performed: TENDON REPAIR  RIGHT FOREARM (Right: Arm Lower)  Patient Location: PACU  Anesthesia Type:General  Level of Consciousness: drowsy and patient cooperative  Airway & Oxygen Therapy: Patient Spontanous Breathing and Patient connected to face mask oxygen  Post-op Assessment: Report given to RN and Post -op Vital signs reviewed and stable  Post vital signs: Reviewed and stable  Last Vitals:  Vitals Value Taken Time  BP 95/62 12/18/22 1749  Temp 36.2 C 12/18/22 1749  Pulse 59 12/18/22 1758  Resp 19 12/18/22 1758  SpO2 100 % 12/18/22 1758  Vitals shown include unfiled device data.  Last Pain:  Vitals:   12/18/22 1749  TempSrc:   PainSc: Asleep      Patients Stated Pain Goal: 0 (12/18/22 1312)  Complications: No notable events documented.

## 2022-12-18 NOTE — Discharge Instructions (Addendum)
[facility] Instructions Hand Surgery  Wound Care: Keep your hand elevated above the level of your heart.  Do not allow it to dangle by your side.  Keep the dressing dry and do not remove it unless your doctor advises you to do so.  He will usually change it at the time of your post-op visit.  Moving your fingers is advised to stimulate circulation but will depend on the site of your surgery.  If you have a splint applied, your doctor will advise you regarding movement.  Activity: Do not drive or operate machinery today.  Rest today and then you may return to your normal activity and work as indicated by your physician.  Diet:  Drink liquids today or eat a light diet.  You may resume a regular diet tomorrow.    General expectations: Pain for two to three days. Fingers may become slightly swollen.  Call your doctor if any of the following occur: Severe pain not relieved by pain medication. Elevated temperature. Dressing soaked with blood. Inability to move fingers. White or bluish color to fingers.     Post Anesthesia Home Care Instructions  Activity: Get plenty of rest for the remainder of the day. A responsible individual must stay with you for 24 hours following the procedure.  For the next 24 hours, DO NOT: -Drive a car -Advertising copywriter -Drink alcoholic beverages -Take any medication unless instructed by your physician -Make any legal decisions or sign important papers.  Meals: Start with liquid foods such as gelatin or soup. Progress to regular foods as tolerated. Avoid greasy, spicy, heavy foods. If nausea and/or vomiting occur, drink only clear liquids until the nausea and/or vomiting subsides. Call your physician if vomiting continues.  Special Instructions/Symptoms: Your throat may feel dry or sore from the anesthesia or the breathing tube placed in your throat during surgery. If this causes discomfort, gargle with warm salt water. The discomfort should disappear  within 24 hours.  If you had a scopolamine patch placed behind your ear for the management of post- operative nausea and/or vomiting:  1. The medication in the patch is effective for 72 hours, after which it should be removed.  Wrap patch in a tissue and discard in the trash. Wash hands thoroughly with soap and water. 2. You may remove the patch earlier than 72 hours if you experience unpleasant side effects which may include dry mouth, dizziness or visual disturbances. 3. Avoid touching the patch. Wash your hands with soap and water after contact with the patch.    May take Tylenol after 11:55pm, if needed May take NSAIDS (ibuprofen/motrin) after midnight, if needed.    Post Anesthesia Home Care Instructions  Activity: Get plenty of rest for the remainder of the day. A responsible individual must stay with you for 24 hours following the procedure.  For the next 24 hours, DO NOT: -Drive a car -Advertising copywriter -Drink alcoholic beverages -Take any medication unless instructed by your physician -Make any legal decisions or sign important papers.  Meals: Start with liquid foods such as gelatin or soup. Progress to regular foods as tolerated. Avoid greasy, spicy, heavy foods. If nausea and/or vomiting occur, drink only clear liquids until the nausea and/or vomiting subsides. Call your physician if vomiting continues.  Special Instructions/Symptoms: Your throat may feel dry or sore from the anesthesia or the breathing tube placed in your throat during surgery. If this causes discomfort, gargle with warm salt water. The discomfort should disappear within 24 hours.  If you had a scopolamine patch placed behind your ear for the management of post- operative nausea and/or vomiting:  1. The medication in the patch is effective for 72 hours, after which it should be removed.  Wrap patch in a tissue and discard in the trash. Wash hands thoroughly with soap and water. 2. You may remove the patch  earlier than 72 hours if you experience unpleasant side effects which may include dry mouth, dizziness or visual disturbances. 3. Avoid touching the patch. Wash your hands with soap and water after contact with the patch.

## 2022-12-18 NOTE — Anesthesia Procedure Notes (Deleted)
Procedure Name: Intubation Date/Time: 12/18/2022 4:25 PM  Performed by: Alvera Novel, CRNAPre-anesthesia Checklist: Patient identified, Emergency Drugs available, Suction available and Patient being monitored Patient Re-evaluated:Patient Re-evaluated prior to induction Oxygen Delivery Method: Circle System Utilized Preoxygenation: Pre-oxygenation with 100% oxygen Induction Type: IV induction Ventilation: Mask ventilation without difficulty Tube type: Oral Number of attempts: 1 Airway Equipment and Method: Stylet and Oral airway Placement Confirmation: ETT inserted through vocal cords under direct vision, positive ETCO2 and breath sounds checked- equal and bilateral Tube secured with: Tape Dental Injury: Teeth and Oropharynx as per pre-operative assessment

## 2022-12-18 NOTE — Op Note (Signed)
I assisted Surgeons and Role:    * Betha Loa, MD - Primary    Cindee Salt, MD - Assisting on the Procedure(s): TENDON REPAIR  RIGHT FOREARM on 12/18/2022.  I provided assistance on this case as follows: Set up, approach, the laceration, notification of the laceration to multiple extensor tendons, repair of multiple extensor tendons, closure of the wound application of the dressing and splints.  Electronically signed by: Cindee Salt, MD Date: 12/18/2022 Time: 5:38 PM

## 2022-12-21 LAB — AEROBIC CULTURE W GRAM STAIN (SUPERFICIAL SPECIMEN)
Culture: NO GROWTH
Gram Stain: NONE SEEN

## 2022-12-21 NOTE — Anesthesia Postprocedure Evaluation (Signed)
Anesthesia Post Note  Patient: Aaron Morris  Procedure(s) Performed: TENDON REPAIR  RIGHT FOREARM (Right: Arm Lower)     Patient location during evaluation: PACU Anesthesia Type: General Level of consciousness: awake Pain management: pain level controlled Vital Signs Assessment: post-procedure vital signs reviewed and stable Respiratory status: spontaneous breathing, nonlabored ventilation and respiratory function stable Cardiovascular status: blood pressure returned to baseline and stable Postop Assessment: no apparent nausea or vomiting Anesthetic complications: no   No notable events documented.  Last Vitals:  Vitals:   12/18/22 1830 12/18/22 1845  BP: 116/76 (!) 126/90  Pulse: 70 67  Resp: 16 16  Temp:  36.7 C  SpO2: 100% 100%    Last Pain:  Vitals:   12/18/22 1845  TempSrc:   PainSc: 5    Pain Goal: Patients Stated Pain Goal: 3 (12/18/22 1845)                 Catheryn Bacon Gilford Lardizabal

## 2022-12-22 ENCOUNTER — Encounter (HOSPITAL_BASED_OUTPATIENT_CLINIC_OR_DEPARTMENT_OTHER): Payer: Self-pay | Admitting: Orthopedic Surgery

## 2022-12-23 LAB — AEROBIC/ANAEROBIC CULTURE W GRAM STAIN (SURGICAL/DEEP WOUND): Culture: NO GROWTH
